# Patient Record
Sex: Female | Born: 1940
Health system: Southern US, Community
[De-identification: ages and names within clinical notes are randomized; demographics above are authoritative.]

## PROBLEM LIST (undated history)

## (undated) DIAGNOSIS — I509 Heart failure, unspecified: Secondary | ICD-10-CM

## (undated) DIAGNOSIS — E119 Type 2 diabetes mellitus without complications: Secondary | ICD-10-CM

## (undated) DIAGNOSIS — I10 Essential (primary) hypertension: Secondary | ICD-10-CM

## (undated) HISTORY — PX: EYE SURGERY: SHX253

---

## 2013-08-14 ENCOUNTER — Emergency Department (INDEPENDENT_AMBULATORY_CARE_PROVIDER_SITE_OTHER)
Admission: EM | Admit: 2013-08-14 | Discharge: 2013-08-14 | Disposition: A | Discharge status: Home / Self Care | Payer: Self-pay | Source: Home / Self Care | Attending: Family Medicine | Admitting: Family Medicine

## 2013-08-14 ENCOUNTER — Encounter (HOSPITAL_COMMUNITY): Payer: Self-pay | Admitting: Emergency Medicine

## 2013-08-14 ENCOUNTER — Emergency Department (INDEPENDENT_AMBULATORY_CARE_PROVIDER_SITE_OTHER): Payer: Self-pay

## 2013-08-14 DIAGNOSIS — M19019 Primary osteoarthritis, unspecified shoulder: Secondary | ICD-10-CM

## 2013-08-14 DIAGNOSIS — M12811 Other specific arthropathies, not elsewhere classified, right shoulder: Secondary | ICD-10-CM

## 2013-08-14 MED ORDER — TRAMADOL HCL 50 MG PO TABS: 50.0000 mg | ORAL_TABLET | Freq: Four times a day (QID) | ORAL | Status: DC | PRN

## 2013-08-14 NOTE — ED Notes (Signed)
Pain in shoulder; assess by MD only

## 2013-08-14 NOTE — ED Provider Notes (Signed)
Wanda Johnston is a 72 y.o. female who presents to Urgent Care today for right shoulder pain. Patient fell onto her outstretched right arm for 5 days ago. She had mild shoulder pain initially over her pain is worsening over the last several days. She has pain with attempted abduction and inability to abduct her arm more than about 90 actively. She denies any neck pain radiating pain weakness or numbness. The pain is moderate and worse with activity.    History reviewed. No pertinent past medical history. History of migraine headaches History  Substance Use Topics  . Smoking status: Never Smoker   . Smokeless tobacco: Not on file  . Alcohol Use: Not on file   ROS as above Medications reviewed. No current facility-administered medications for this encounter.   Current Outpatient Prescriptions  Medication Sig Dispense Refill  . traMADol (ULTRAM) 50 MG tablet Take 1 tablet (50 mg total) by mouth every 6 (six) hours as needed for pain.  30 tablet  0    Exam:  BP 159/67  Pulse 73  Temp(Src) 98.2 F (36.8 C) (Oral)  Resp 18  SpO2 95% Gen: Well NAD NECK: Nontender to spinal midline normal neck range of motion. Tender palpation right trapezius.  Right shoulder: Normal-appearing. Mildly tender overlying the a.c. Joint Normal external and internal range of motion Active abduction limited to about 90 passive full Week with resisted abduction. Week with resisted internal rotation. Normal external rotation strength.  Mildly positive impingement testing.  Left shoulder: Nontender normal range of motion normal strength.  Grip strength capillary refill sensation is intact distally  Limited musculoskeletal ultrasound of the right shoulder: Biceps tendon is intact and in the groove Subscapularis is degenerative appearing however appears to be grossly intact Supraspinatus has a linear hypoechoic streak characteristic of a full thickness tear without retraction Infraspinatus is  normal-appearing A.c. joint is degenerative appearing with effusion  No results found for this or any previous visit (from the past 24 hour(s)). Dg Shoulder Right  08/14/2013   CLINICAL DATA:  Right shoulder pain after fall.  EXAM: RIGHT SHOULDER - 2+ VIEW  COMPARISON:  None.  FINDINGS: There is no evidence of fracture or dislocation. The right acromioclavicular and glenohumeral joints appear normal. Focal calcification is seen lateral to the greater tuberosity which may represent calcific tendinitis. Soft tissues are unremarkable.  IMPRESSION: Focal calcification seen near greater tuberosity which may represent calcific tendinitis. No other abnormality seen in the right shoulder.   Electronically Signed   By: Roque Lias M.D.   On: 08/14/2013 16:08    Assessment and Plan: 72 y.o. female with full thickness right shoulder rotator cuff tear. Appears to be not retracted.  Plan: Discussed options. Patient does not have health insurance and is likely not a candidate additionally for rotator cuff repair. Plan for range of motion exercises. Will return in 2 weeks at that time will start strengthening program. Additionally I refer patient to the orange card and hopefully we'll be able to get her seen at the [facility].  She is not a candidate for nitroglycerin patch protocol due to history of headaches. Tramadol for pain Discussed warning signs or symptoms. Please see discharge instructions. Patient expresses understanding.     Rodolph Bong, MD 08/14/13 [phone]

## 2014-08-05 DIAGNOSIS — L2084 Intrinsic (allergic) eczema: Secondary | ICD-10-CM | POA: Diagnosis not present

## 2014-08-05 DIAGNOSIS — I10 Essential (primary) hypertension: Secondary | ICD-10-CM | POA: Diagnosis not present

## 2014-08-05 DIAGNOSIS — Z0000 Encounter for general adult medical examination without abnormal findings: Secondary | ICD-10-CM | POA: Diagnosis not present

## 2014-08-05 DIAGNOSIS — F0390 Unspecified dementia without behavioral disturbance: Secondary | ICD-10-CM | POA: Diagnosis not present

## 2014-08-05 DIAGNOSIS — Z800 Family history of malignant neoplasm of digestive organs: Secondary | ICD-10-CM | POA: Diagnosis not present

## 2014-08-05 DIAGNOSIS — Z79899 Other long term (current) drug therapy: Secondary | ICD-10-CM | POA: Diagnosis not present

## 2014-08-05 DIAGNOSIS — H7290 Unspecified perforation of tympanic membrane, unspecified ear: Secondary | ICD-10-CM | POA: Diagnosis not present

## 2014-08-05 DIAGNOSIS — H9190 Unspecified hearing loss, unspecified ear: Secondary | ICD-10-CM | POA: Diagnosis not present

## 2014-08-05 DIAGNOSIS — R740 Nonspecific elevation of levels of transaminase and lactic acid dehydrogenase [LDH]: Secondary | ICD-10-CM | POA: Diagnosis not present

## 2014-08-19 ENCOUNTER — Other Ambulatory Visit: Payer: Self-pay | Admitting: Family Medicine

## 2014-08-19 DIAGNOSIS — R748 Abnormal levels of other serum enzymes: Secondary | ICD-10-CM

## 2015-01-17 DIAGNOSIS — G5600 Carpal tunnel syndrome, unspecified upper limb: Secondary | ICD-10-CM | POA: Diagnosis not present

## 2015-01-17 DIAGNOSIS — I10 Essential (primary) hypertension: Secondary | ICD-10-CM | POA: Diagnosis not present

## 2017-03-14 DIAGNOSIS — Z01812 Encounter for preprocedural laboratory examination: Secondary | ICD-10-CM | POA: Diagnosis not present

## 2017-03-14 DIAGNOSIS — Z23 Encounter for immunization: Secondary | ICD-10-CM | POA: Diagnosis not present

## 2017-03-14 DIAGNOSIS — E782 Mixed hyperlipidemia: Secondary | ICD-10-CM | POA: Diagnosis not present

## 2017-03-14 DIAGNOSIS — Z01818 Encounter for other preprocedural examination: Secondary | ICD-10-CM | POA: Diagnosis not present

## 2017-03-14 DIAGNOSIS — E038 Other specified hypothyroidism: Secondary | ICD-10-CM | POA: Diagnosis not present

## 2017-03-14 DIAGNOSIS — H547 Unspecified visual loss: Secondary | ICD-10-CM | POA: Diagnosis not present

## 2017-03-14 DIAGNOSIS — I10 Essential (primary) hypertension: Secondary | ICD-10-CM | POA: Diagnosis not present

## 2017-03-14 DIAGNOSIS — Z79899 Other long term (current) drug therapy: Secondary | ICD-10-CM | POA: Diagnosis not present

## 2017-04-04 DIAGNOSIS — I10 Essential (primary) hypertension: Secondary | ICD-10-CM | POA: Diagnosis not present

## 2017-04-04 DIAGNOSIS — R55 Syncope and collapse: Secondary | ICD-10-CM | POA: Diagnosis not present

## 2017-04-04 DIAGNOSIS — E039 Hypothyroidism, unspecified: Secondary | ICD-10-CM | POA: Diagnosis not present

## 2017-04-04 DIAGNOSIS — H547 Unspecified visual loss: Secondary | ICD-10-CM | POA: Diagnosis not present

## 2017-06-05 DIAGNOSIS — H2512 Age-related nuclear cataract, left eye: Secondary | ICD-10-CM | POA: Diagnosis not present

## 2017-06-05 DIAGNOSIS — H40013 Open angle with borderline findings, low risk, bilateral: Secondary | ICD-10-CM | POA: Diagnosis not present

## 2017-06-05 DIAGNOSIS — H269 Unspecified cataract: Secondary | ICD-10-CM | POA: Diagnosis not present

## 2017-06-05 DIAGNOSIS — H25013 Cortical age-related cataract, bilateral: Secondary | ICD-10-CM | POA: Diagnosis not present

## 2017-06-05 DIAGNOSIS — H2513 Age-related nuclear cataract, bilateral: Secondary | ICD-10-CM | POA: Diagnosis not present

## 2017-06-05 DIAGNOSIS — H25042 Posterior subcapsular polar age-related cataract, left eye: Secondary | ICD-10-CM | POA: Diagnosis not present

## 2017-07-03 DIAGNOSIS — H2522 Age-related cataract, morgagnian type, left eye: Secondary | ICD-10-CM | POA: Diagnosis not present

## 2017-07-03 DIAGNOSIS — H25812 Combined forms of age-related cataract, left eye: Secondary | ICD-10-CM | POA: Diagnosis not present

## 2017-07-15 DIAGNOSIS — H2511 Age-related nuclear cataract, right eye: Secondary | ICD-10-CM | POA: Diagnosis not present

## 2017-07-15 DIAGNOSIS — H25011 Cortical age-related cataract, right eye: Secondary | ICD-10-CM | POA: Diagnosis not present

## 2017-07-17 DIAGNOSIS — H25811 Combined forms of age-related cataract, right eye: Secondary | ICD-10-CM | POA: Diagnosis not present

## 2017-07-17 DIAGNOSIS — H2511 Age-related nuclear cataract, right eye: Secondary | ICD-10-CM | POA: Diagnosis not present

## 2017-11-14 DIAGNOSIS — H04123 Dry eye syndrome of bilateral lacrimal glands: Secondary | ICD-10-CM | POA: Diagnosis not present

## 2017-11-14 DIAGNOSIS — H40013 Open angle with borderline findings, low risk, bilateral: Secondary | ICD-10-CM | POA: Diagnosis not present

## 2017-11-14 DIAGNOSIS — H04223 Epiphora due to insufficient drainage, bilateral lacrimal glands: Secondary | ICD-10-CM | POA: Diagnosis not present

## 2018-03-26 DIAGNOSIS — H04122 Dry eye syndrome of left lacrimal gland: Secondary | ICD-10-CM | POA: Diagnosis not present

## 2018-03-26 DIAGNOSIS — H04221 Epiphora due to insufficient drainage, right lacrimal gland: Secondary | ICD-10-CM | POA: Diagnosis not present

## 2018-03-26 DIAGNOSIS — H04123 Dry eye syndrome of bilateral lacrimal glands: Secondary | ICD-10-CM | POA: Diagnosis not present

## 2018-03-26 DIAGNOSIS — Z961 Presence of intraocular lens: Secondary | ICD-10-CM | POA: Diagnosis not present

## 2018-03-26 DIAGNOSIS — H04223 Epiphora due to insufficient drainage, bilateral lacrimal glands: Secondary | ICD-10-CM | POA: Diagnosis not present

## 2018-03-26 DIAGNOSIS — H04121 Dry eye syndrome of right lacrimal gland: Secondary | ICD-10-CM | POA: Diagnosis not present

## 2018-03-26 DIAGNOSIS — H40013 Open angle with borderline findings, low risk, bilateral: Secondary | ICD-10-CM | POA: Diagnosis not present

## 2018-03-26 DIAGNOSIS — H04222 Epiphora due to insufficient drainage, left lacrimal gland: Secondary | ICD-10-CM | POA: Diagnosis not present

## 2018-05-12 DIAGNOSIS — E782 Mixed hyperlipidemia: Secondary | ICD-10-CM | POA: Diagnosis not present

## 2018-05-12 DIAGNOSIS — Z1211 Encounter for screening for malignant neoplasm of colon: Secondary | ICD-10-CM | POA: Diagnosis not present

## 2018-05-12 DIAGNOSIS — Z79899 Other long term (current) drug therapy: Secondary | ICD-10-CM | POA: Diagnosis not present

## 2018-05-12 DIAGNOSIS — R7301 Impaired fasting glucose: Secondary | ICD-10-CM | POA: Diagnosis not present

## 2018-05-12 DIAGNOSIS — E0590 Thyrotoxicosis, unspecified without thyrotoxic crisis or storm: Secondary | ICD-10-CM | POA: Diagnosis not present

## 2018-05-12 DIAGNOSIS — E039 Hypothyroidism, unspecified: Secondary | ICD-10-CM | POA: Diagnosis not present

## 2018-05-12 DIAGNOSIS — Z23 Encounter for immunization: Secondary | ICD-10-CM | POA: Diagnosis not present

## 2018-05-12 DIAGNOSIS — F0390 Unspecified dementia without behavioral disturbance: Secondary | ICD-10-CM | POA: Diagnosis not present

## 2018-05-12 DIAGNOSIS — Z0000 Encounter for general adult medical examination without abnormal findings: Secondary | ICD-10-CM | POA: Diagnosis not present

## 2018-05-12 DIAGNOSIS — I10 Essential (primary) hypertension: Secondary | ICD-10-CM | POA: Diagnosis not present

## 2018-05-12 DIAGNOSIS — E559 Vitamin D deficiency, unspecified: Secondary | ICD-10-CM | POA: Diagnosis not present

## 2018-05-12 DIAGNOSIS — G301 Alzheimer's disease with late onset: Secondary | ICD-10-CM | POA: Diagnosis not present

## 2018-05-12 DIAGNOSIS — R7303 Prediabetes: Secondary | ICD-10-CM | POA: Diagnosis not present

## 2018-05-19 DIAGNOSIS — E559 Vitamin D deficiency, unspecified: Secondary | ICD-10-CM | POA: Diagnosis not present

## 2018-05-19 DIAGNOSIS — E1169 Type 2 diabetes mellitus with other specified complication: Secondary | ICD-10-CM | POA: Diagnosis not present

## 2018-05-19 DIAGNOSIS — E782 Mixed hyperlipidemia: Secondary | ICD-10-CM | POA: Diagnosis not present

## 2019-04-06 DIAGNOSIS — Z961 Presence of intraocular lens: Secondary | ICD-10-CM | POA: Diagnosis not present

## 2019-04-06 DIAGNOSIS — H26493 Other secondary cataract, bilateral: Secondary | ICD-10-CM | POA: Diagnosis not present

## 2019-04-06 DIAGNOSIS — H40013 Open angle with borderline findings, low risk, bilateral: Secondary | ICD-10-CM | POA: Diagnosis not present

## 2019-04-06 DIAGNOSIS — H04123 Dry eye syndrome of bilateral lacrimal glands: Secondary | ICD-10-CM | POA: Diagnosis not present

## 2019-06-15 DIAGNOSIS — H04123 Dry eye syndrome of bilateral lacrimal glands: Secondary | ICD-10-CM | POA: Diagnosis not present

## 2019-06-15 DIAGNOSIS — H16223 Keratoconjunctivitis sicca, not specified as Sjogren's, bilateral: Secondary | ICD-10-CM | POA: Diagnosis not present

## 2019-07-13 DIAGNOSIS — E0590 Thyrotoxicosis, unspecified without thyrotoxic crisis or storm: Secondary | ICD-10-CM | POA: Diagnosis not present

## 2019-07-13 DIAGNOSIS — Z1211 Encounter for screening for malignant neoplasm of colon: Secondary | ICD-10-CM | POA: Diagnosis not present

## 2019-07-13 DIAGNOSIS — E559 Vitamin D deficiency, unspecified: Secondary | ICD-10-CM | POA: Diagnosis not present

## 2019-07-13 DIAGNOSIS — I10 Essential (primary) hypertension: Secondary | ICD-10-CM | POA: Diagnosis not present

## 2019-07-13 DIAGNOSIS — Z0000 Encounter for general adult medical examination without abnormal findings: Secondary | ICD-10-CM | POA: Diagnosis not present

## 2019-07-13 DIAGNOSIS — E782 Mixed hyperlipidemia: Secondary | ICD-10-CM | POA: Diagnosis not present

## 2019-07-13 DIAGNOSIS — Z79899 Other long term (current) drug therapy: Secondary | ICD-10-CM | POA: Diagnosis not present

## 2019-07-13 DIAGNOSIS — E1169 Type 2 diabetes mellitus with other specified complication: Secondary | ICD-10-CM | POA: Diagnosis not present

## 2019-07-13 DIAGNOSIS — Z7984 Long term (current) use of oral hypoglycemic drugs: Secondary | ICD-10-CM | POA: Diagnosis not present

## 2019-07-13 DIAGNOSIS — H7290 Unspecified perforation of tympanic membrane, unspecified ear: Secondary | ICD-10-CM | POA: Diagnosis not present

## 2019-07-13 DIAGNOSIS — L309 Dermatitis, unspecified: Secondary | ICD-10-CM | POA: Diagnosis not present

## 2019-07-13 DIAGNOSIS — G301 Alzheimer's disease with late onset: Secondary | ICD-10-CM | POA: Diagnosis not present

## 2019-07-31 DIAGNOSIS — G720 Drug-induced myopathy: Secondary | ICD-10-CM | POA: Diagnosis not present

## 2019-07-31 DIAGNOSIS — E1169 Type 2 diabetes mellitus with other specified complication: Secondary | ICD-10-CM | POA: Diagnosis not present

## 2019-09-21 DIAGNOSIS — E7800 Pure hypercholesterolemia, unspecified: Secondary | ICD-10-CM | POA: Diagnosis not present

## 2019-09-21 DIAGNOSIS — Z79899 Other long term (current) drug therapy: Secondary | ICD-10-CM | POA: Diagnosis not present

## 2019-09-21 DIAGNOSIS — E0590 Thyrotoxicosis, unspecified without thyrotoxic crisis or storm: Secondary | ICD-10-CM | POA: Diagnosis not present

## 2019-09-21 DIAGNOSIS — Z7984 Long term (current) use of oral hypoglycemic drugs: Secondary | ICD-10-CM | POA: Diagnosis not present

## 2019-09-21 DIAGNOSIS — E1165 Type 2 diabetes mellitus with hyperglycemia: Secondary | ICD-10-CM | POA: Diagnosis not present

## 2020-01-05 DIAGNOSIS — Z20828 Contact with and (suspected) exposure to other viral communicable diseases: Secondary | ICD-10-CM | POA: Diagnosis not present

## 2020-01-27 DIAGNOSIS — Z20822 Contact with and (suspected) exposure to covid-19: Secondary | ICD-10-CM | POA: Diagnosis not present

## 2020-05-26 DIAGNOSIS — E1165 Type 2 diabetes mellitus with hyperglycemia: Secondary | ICD-10-CM | POA: Diagnosis not present

## 2020-05-26 DIAGNOSIS — Z79899 Other long term (current) drug therapy: Secondary | ICD-10-CM | POA: Diagnosis not present

## 2020-05-26 DIAGNOSIS — E7800 Pure hypercholesterolemia, unspecified: Secondary | ICD-10-CM | POA: Diagnosis not present

## 2020-05-26 DIAGNOSIS — E0590 Thyrotoxicosis, unspecified without thyrotoxic crisis or storm: Secondary | ICD-10-CM | POA: Diagnosis not present

## 2020-08-01 DIAGNOSIS — Z800 Family history of malignant neoplasm of digestive organs: Secondary | ICD-10-CM | POA: Diagnosis not present

## 2020-08-01 DIAGNOSIS — Z79899 Other long term (current) drug therapy: Secondary | ICD-10-CM | POA: Diagnosis not present

## 2020-08-01 DIAGNOSIS — H9190 Unspecified hearing loss, unspecified ear: Secondary | ICD-10-CM | POA: Diagnosis not present

## 2020-08-01 DIAGNOSIS — E7800 Pure hypercholesterolemia, unspecified: Secondary | ICD-10-CM | POA: Diagnosis not present

## 2020-08-01 DIAGNOSIS — Z0000 Encounter for general adult medical examination without abnormal findings: Secondary | ICD-10-CM | POA: Diagnosis not present

## 2020-08-01 DIAGNOSIS — L2084 Intrinsic (allergic) eczema: Secondary | ICD-10-CM | POA: Diagnosis not present

## 2020-08-01 DIAGNOSIS — I10 Essential (primary) hypertension: Secondary | ICD-10-CM | POA: Diagnosis not present

## 2020-08-01 DIAGNOSIS — G720 Drug-induced myopathy: Secondary | ICD-10-CM | POA: Diagnosis not present

## 2020-08-01 DIAGNOSIS — E0590 Thyrotoxicosis, unspecified without thyrotoxic crisis or storm: Secondary | ICD-10-CM | POA: Diagnosis not present

## 2020-08-01 DIAGNOSIS — E1165 Type 2 diabetes mellitus with hyperglycemia: Secondary | ICD-10-CM | POA: Diagnosis not present

## 2020-08-01 DIAGNOSIS — G301 Alzheimer's disease with late onset: Secondary | ICD-10-CM | POA: Diagnosis not present

## 2020-08-01 DIAGNOSIS — E11620 Type 2 diabetes mellitus with diabetic dermatitis: Secondary | ICD-10-CM | POA: Diagnosis not present

## 2020-10-31 DIAGNOSIS — U071 COVID-19: Secondary | ICD-10-CM | POA: Diagnosis not present

## 2020-10-31 DIAGNOSIS — R059 Cough, unspecified: Secondary | ICD-10-CM | POA: Diagnosis not present

## 2020-10-31 DIAGNOSIS — B349 Viral infection, unspecified: Secondary | ICD-10-CM | POA: Diagnosis not present

## 2020-11-02 ENCOUNTER — Encounter (HOSPITAL_BASED_OUTPATIENT_CLINIC_OR_DEPARTMENT_OTHER): Payer: Self-pay

## 2020-11-02 ENCOUNTER — Emergency Department (HOSPITAL_BASED_OUTPATIENT_CLINIC_OR_DEPARTMENT_OTHER): Payer: Medicare Other

## 2020-11-02 ENCOUNTER — Other Ambulatory Visit: Payer: Self-pay

## 2020-11-02 ENCOUNTER — Inpatient Hospital Stay (HOSPITAL_BASED_OUTPATIENT_CLINIC_OR_DEPARTMENT_OTHER)
Admission: EM | Admit: 2020-11-02 | Discharge: 2020-11-07 | DRG: 177 | Disposition: A | Discharge status: Home / Self Care | Payer: Medicare Other | Attending: Internal Medicine | Admitting: Internal Medicine

## 2020-11-02 DIAGNOSIS — Z79899 Other long term (current) drug therapy: Secondary | ICD-10-CM

## 2020-11-02 DIAGNOSIS — E1165 Type 2 diabetes mellitus with hyperglycemia: Secondary | ICD-10-CM | POA: Diagnosis present

## 2020-11-02 DIAGNOSIS — R739 Hyperglycemia, unspecified: Secondary | ICD-10-CM | POA: Diagnosis present

## 2020-11-02 DIAGNOSIS — J1282 Pneumonia due to coronavirus disease 2019: Secondary | ICD-10-CM | POA: Diagnosis present

## 2020-11-02 DIAGNOSIS — E869 Volume depletion, unspecified: Secondary | ICD-10-CM | POA: Diagnosis not present

## 2020-11-02 DIAGNOSIS — E871 Hypo-osmolality and hyponatremia: Secondary | ICD-10-CM | POA: Diagnosis not present

## 2020-11-02 DIAGNOSIS — U071 COVID-19: Principal | ICD-10-CM | POA: Diagnosis present

## 2020-11-02 DIAGNOSIS — J9601 Acute respiratory failure with hypoxia: Secondary | ICD-10-CM | POA: Diagnosis not present

## 2020-11-02 DIAGNOSIS — K824 Cholesterolosis of gallbladder: Secondary | ICD-10-CM | POA: Diagnosis present

## 2020-11-02 DIAGNOSIS — R0602 Shortness of breath: Secondary | ICD-10-CM | POA: Diagnosis not present

## 2020-11-02 DIAGNOSIS — E876 Hypokalemia: Secondary | ICD-10-CM | POA: Diagnosis present

## 2020-11-02 DIAGNOSIS — I110 Hypertensive heart disease with heart failure: Secondary | ICD-10-CM | POA: Diagnosis present

## 2020-11-02 DIAGNOSIS — T380X5A Adverse effect of glucocorticoids and synthetic analogues, initial encounter: Secondary | ICD-10-CM | POA: Diagnosis not present

## 2020-11-02 DIAGNOSIS — Z794 Long term (current) use of insulin: Secondary | ICD-10-CM

## 2020-11-02 DIAGNOSIS — K219 Gastro-esophageal reflux disease without esophagitis: Secondary | ICD-10-CM | POA: Diagnosis present

## 2020-11-02 DIAGNOSIS — E785 Hyperlipidemia, unspecified: Secondary | ICD-10-CM | POA: Diagnosis present

## 2020-11-02 DIAGNOSIS — R7989 Other specified abnormal findings of blood chemistry: Secondary | ICD-10-CM | POA: Diagnosis present

## 2020-11-02 DIAGNOSIS — I509 Heart failure, unspecified: Secondary | ICD-10-CM | POA: Diagnosis present

## 2020-11-02 HISTORY — DX: Type 2 diabetes mellitus without complications: E11.9

## 2020-11-02 HISTORY — DX: Heart failure, unspecified: I50.9

## 2020-11-02 HISTORY — DX: Essential (primary) hypertension: I10

## 2020-11-02 LAB — CBC WITH DIFFERENTIAL/PLATELET
Abs Immature Granulocytes: 0.02 10*3/uL (ref 0.00–0.07)
Basophils Absolute: 0 10*3/uL (ref 0.0–0.1)
Basophils Relative: 0 %
Eosinophils Absolute: 0.1 10*3/uL (ref 0.0–0.5)
Eosinophils Relative: 1 %
HCT: 42.6 % (ref 36.0–46.0)
Hemoglobin: 14.5 g/dL (ref 12.0–15.0)
Immature Granulocytes: 0 %
Lymphocytes Relative: 55 %
Lymphs Abs: 3.3 10*3/uL (ref 0.7–4.0)
MCH: 31.3 pg (ref 26.0–34.0)
MCHC: 34 g/dL (ref 30.0–36.0)
MCV: 92 fL (ref 80.0–100.0)
Monocytes Absolute: 0.6 10*3/uL (ref 0.1–1.0)
Monocytes Relative: 10 %
Neutro Abs: 2 10*3/uL (ref 1.7–7.7)
Neutrophils Relative %: 34 %
Platelets: 154 10*3/uL (ref 150–400)
RBC: 4.63 MIL/uL (ref 3.87–5.11)
RDW: 12.6 % (ref 11.5–15.5)
WBC: 6.1 10*3/uL (ref 4.0–10.5)
nRBC: 0 % (ref 0.0–0.2)

## 2020-11-02 LAB — RESP PANEL BY RT-PCR (FLU A&B, COVID) ARPGX2
Influenza A by PCR: NEGATIVE
Influenza B by PCR: NEGATIVE
SARS Coronavirus 2 by RT PCR: POSITIVE — AB

## 2020-11-02 LAB — COMPREHENSIVE METABOLIC PANEL
ALT: 61 U/L — ABNORMAL HIGH (ref 0–44)
AST: 85 U/L — ABNORMAL HIGH (ref 15–41)
Albumin: 3.5 g/dL (ref 3.5–5.0)
Alkaline Phosphatase: 56 U/L (ref 38–126)
Anion gap: 12 (ref 5–15)
BUN: 13 mg/dL (ref 8–23)
CO2: 26 mmol/L (ref 22–32)
Calcium: 8.1 mg/dL — ABNORMAL LOW (ref 8.9–10.3)
Chloride: 92 mmol/L — ABNORMAL LOW (ref 98–111)
Creatinine, Ser: 0.86 mg/dL (ref 0.44–1.00)
GFR, Estimated: >60 mL/min (ref >60)
Glucose, Bld: 175 mg/dL — ABNORMAL HIGH (ref 70–99)
Potassium: 2.8 mmol/L — ABNORMAL LOW (ref 3.5–5.1)
Sodium: 130 mmol/L — ABNORMAL LOW (ref 135–145)
Total Bilirubin: 0.6 mg/dL (ref 0.3–1.2)
Total Protein: 7.5 g/dL (ref 6.5–8.1)

## 2020-11-02 LAB — FIBRINOGEN: Fibrinogen: 492 mg/dL — ABNORMAL HIGH (ref 210–475)

## 2020-11-02 LAB — LACTATE DEHYDROGENASE: LDH: 329 U/L — ABNORMAL HIGH (ref 98–192)

## 2020-11-02 LAB — CBG MONITORING, ED
Glucose-Capillary: 175 mg/dL — ABNORMAL HIGH (ref 70–99)
Glucose-Capillary: 283 mg/dL — ABNORMAL HIGH (ref 70–99)
Glucose-Capillary: 313 mg/dL — ABNORMAL HIGH (ref 70–99)

## 2020-11-02 LAB — LACTIC ACID, PLASMA: Lactic Acid, Venous: 1.4 mmol/L (ref 0.5–1.9)

## 2020-11-02 LAB — PROCALCITONIN: Procalcitonin: <0.1 ng/mL

## 2020-11-02 LAB — MAGNESIUM: Magnesium: 1.6 mg/dL — ABNORMAL LOW (ref 1.7–2.4)

## 2020-11-02 LAB — D-DIMER, QUANTITATIVE: D-Dimer, Quant: 0.89 ug/mL-FEU — ABNORMAL HIGH (ref 0.00–0.50)

## 2020-11-02 LAB — C-REACTIVE PROTEIN: CRP: 3.7 mg/dL — ABNORMAL HIGH (ref <1.0)

## 2020-11-02 LAB — TRIGLYCERIDES: Triglycerides: 208 mg/dL — ABNORMAL HIGH (ref <150)

## 2020-11-02 LAB — FERRITIN: Ferritin: 1467 ng/mL — ABNORMAL HIGH (ref 11–307)

## 2020-11-02 MED ORDER — DEXAMETHASONE SODIUM PHOSPHATE 10 MG/ML IJ SOLN
6.0000 mg | Freq: Once | INTRAMUSCULAR | Status: AC
  Administered 2020-11-02: 6 mg via INTRAVENOUS
  Filled 2020-11-02: qty 1

## 2020-11-02 MED ORDER — INSULIN ASPART 100 UNIT/ML ~~LOC~~ SOLN
0.0000 [IU] | Freq: Every day | SUBCUTANEOUS | Status: DC
  Administered 2020-11-02 – 2020-11-03 (×2): 4 [IU] via SUBCUTANEOUS
  Filled 2020-11-02: qty 4

## 2020-11-02 MED ORDER — METHYLPREDNISOLONE SODIUM SUCC 125 MG IJ SOLR
80.0000 mg | INTRAMUSCULAR | Status: DC
  Administered 2020-11-02 – 2020-11-06 (×5): 80 mg via INTRAVENOUS
  Filled 2020-11-02 (×6): qty 2

## 2020-11-02 MED ORDER — SODIUM CHLORIDE 0.9 % IV SOLN
100.0000 mg | Freq: Once | INTRAVENOUS | Status: AC
  Administered 2020-11-02: 100 mg via INTRAVENOUS

## 2020-11-02 MED ORDER — SODIUM CHLORIDE 0.9 % IV SOLN
100.0000 mg | Freq: Every day | INTRAVENOUS | Status: AC
  Administered 2020-11-02 – 2020-11-06 (×5): 100 mg via INTRAVENOUS
  Filled 2020-11-02 (×3): qty 20

## 2020-11-02 MED ORDER — INSULIN ASPART 100 UNIT/ML ~~LOC~~ SOLN
0.0000 [IU] | Freq: Three times a day (TID) | SUBCUTANEOUS | Status: DC
  Administered 2020-11-03: 8 [IU] via SUBCUTANEOUS
  Administered 2020-11-03: 11 [IU] via SUBCUTANEOUS
  Administered 2020-11-03: 8 [IU] via SUBCUTANEOUS
  Administered 2020-11-04 (×2): 5 [IU] via SUBCUTANEOUS
  Administered 2020-11-04: 3 [IU] via SUBCUTANEOUS
  Administered 2020-11-05: 5 [IU] via SUBCUTANEOUS
  Administered 2020-11-05 (×2): 3 [IU] via SUBCUTANEOUS
  Administered 2020-11-06 (×2): 5 [IU] via SUBCUTANEOUS
  Administered 2020-11-06: 3 [IU] via SUBCUTANEOUS
  Administered 2020-11-07 (×2): 11 [IU] via SUBCUTANEOUS

## 2020-11-02 MED ORDER — ACETAMINOPHEN 325 MG PO TABS
650.0000 mg | ORAL_TABLET | Freq: Once | ORAL | Status: AC
  Administered 2020-11-02: 650 mg via ORAL
  Filled 2020-11-02: qty 2

## 2020-11-02 MED ORDER — MAGNESIUM SULFATE 2 GM/50ML IV SOLN
2.0000 g | Freq: Once | INTRAVENOUS | Status: AC
  Administered 2020-11-02: 2 g via INTRAVENOUS
  Filled 2020-11-02: qty 50

## 2020-11-02 MED ORDER — SODIUM CHLORIDE 0.9 % IV SOLN
100.0000 mg | Freq: Once | INTRAVENOUS | Status: AC
  Administered 2020-11-06: 100 mg via INTRAVENOUS
  Filled 2020-11-02: qty 20

## 2020-11-02 MED ORDER — POTASSIUM CHLORIDE CRYS ER 20 MEQ PO TBCR
40.0000 meq | EXTENDED_RELEASE_TABLET | Freq: Once | ORAL | Status: AC
  Administered 2020-11-02: 40 meq via ORAL
  Filled 2020-11-02: qty 2

## 2020-11-02 MED ORDER — ALBUTEROL SULFATE HFA 108 (90 BASE) MCG/ACT IN AERS: 2.0000 | INHALATION_SPRAY | RESPIRATORY_TRACT | Status: DC | PRN

## 2020-11-02 NOTE — ED Provider Notes (Signed)
[facility] EMERGENCY DEPARTMENT Provider Note   CSN: [id number] Arrival date & time: 11/02/20  1656     History Chief Complaint  Patient presents with  . Covid Positive    Wanda Johnston is a 80 y.o. female.  The history is provided by the patient, medical records and a relative.   Wanda Johnston is a 80 y.o. female who presents to the Emergency Department complaining of difficulty breathing. She presents the emergency department accompanied by her daughter for evaluation of difficulty breathing. She began feeling ill on December 30 with nausea, vomiting and malaise. Her symptoms progressed have sore throat and body aches. Fever started on Monday and she began having difficulty breathing today. Her daughter checked her pulse ox because she was having dizziness when trying to ambulate. Daughter reports pulse ox of 88% at rest and drops to the 70s if she attempts to ambulate. She has a history of CHF, diabetes, hypertension. She had an outpatient COVID test performed on Monday and was resulted back today. It was positive. She has not been vaccinated for COVID-19. She has been exposed to individuals with COVID-19 a week ago.    Past Medical History:  Diagnosis Date  . CHF (congestive heart failure) (HCC)   . Diabetes mellitus without complication (HCC)   . Hypertension     Patient Active Problem List   Diagnosis Date Noted  . Acute respiratory failure with hypoxia (HCC) 11/02/2020    Past Surgical History:  Procedure Laterality Date  . EYE SURGERY       OB History   No obstetric history on file.     History reviewed. No pertinent family history.  Social History   Tobacco Use  . Smoking status: Never Smoker  Substance Use Topics  . Alcohol use: Never  . Drug use: Never    Home Medications Prior to Admission medications   Medication Sig Start Date End Date Taking? Authorizing Provider  traMADol (ULTRAM) 50 MG tablet Take 1 tablet (50 mg total) by mouth  every 6 (six) hours as needed for pain. 08/14/13   Rodolph Bong, MD    Allergies    Patient has no known allergies.  Review of Systems   Review of Systems  All other systems reviewed and are negative.   Physical Exam Updated Vital Signs BP 134/74   Pulse 73   Temp (!) 100.7 F (38.2 C) (Oral) Comment: RN Jamie informed  Resp (!) 25   Ht 5' (1.524 m)   Wt 63.5 kg   SpO2 94%   BMI 27.34 kg/m   Physical Exam Vitals and nursing note reviewed.  Constitutional:      General: She is in acute distress.     Appearance: She is well-developed and well-nourished. She is ill-appearing.  HENT:     Head: Normocephalic and atraumatic.  Cardiovascular:     Rate and Rhythm: Normal rate and regular rhythm.     Heart sounds: No murmur heard.   Pulmonary:     Effort: Pulmonary effort is normal. No respiratory distress.     Breath sounds: Normal breath sounds.     Comments: Occasional crackles bilaterally Abdominal:     Palpations: Abdomen is soft.     Tenderness: There is no abdominal tenderness. There is no guarding or rebound.  Musculoskeletal:        General: No tenderness or edema.  Skin:    General: Skin is warm and dry.  Neurological:  Mental Status: She is alert and oriented to person, place, and time.  Psychiatric:        Mood and Affect: Mood and affect normal.        Behavior: Behavior normal.     ED Results / Procedures / Treatments   Labs (all labs ordered are listed, but only abnormal results are displayed) Labs Reviewed  RESP PANEL BY RT-PCR (FLU A&B, COVID) ARPGX2 - Abnormal; Notable for the following components:      Result Value   SARS Coronavirus 2 by RT PCR POSITIVE (*)    All other components within normal limits  COMPREHENSIVE METABOLIC PANEL - Abnormal; Notable for the following components:   Sodium 130 (*)    Potassium 2.8 (*)    Chloride 92 (*)    Glucose, Bld 175 (*)    Calcium 8.1 (*)    AST 85 (*)    ALT 61 (*)    All other components  within normal limits  D-DIMER, QUANTITATIVE (NOT AT [facility]) - Abnormal; Notable for the following components:   D-Dimer, Quant 0.89 (*)    All other components within normal limits  LACTATE DEHYDROGENASE - Abnormal; Notable for the following components:   LDH 329 (*)    All other components within normal limits  FERRITIN - Abnormal; Notable for the following components:   Ferritin 1,467 (*)    All other components within normal limits  TRIGLYCERIDES - Abnormal; Notable for the following components:   Triglycerides 208 (*)    All other components within normal limits  FIBRINOGEN - Abnormal; Notable for the following components:   Fibrinogen 492 (*)    All other components within normal limits  C-REACTIVE PROTEIN - Abnormal; Notable for the following components:   CRP 3.7 (*)    All other components within normal limits  MAGNESIUM - Abnormal; Notable for the following components:   Magnesium 1.6 (*)    All other components within normal limits  CBG MONITORING, ED - Abnormal; Notable for the following components:   Glucose-Capillary 175 (*)    All other components within normal limits  CBG MONITORING, ED - Abnormal; Notable for the following components:   Glucose-Capillary 313 (*)    All other components within normal limits  CULTURE, BLOOD (ROUTINE X 2)  CULTURE, BLOOD (ROUTINE X 2)  LACTIC ACID, PLASMA  CBC WITH DIFFERENTIAL/PLATELET  PROCALCITONIN  LACTIC ACID, PLASMA  HEMOGLOBIN A1C    EKG EKG Interpretation  Date/Time:  Wednesday November 02 2020 17:15:43 EST Ventricular Rate:  96 PR Interval:    QRS Duration: 85 QT Interval:  358 QTC Calculation: 453 R Axis:   -13 Text Interpretation: Sinus rhythm Probable anterior infarct, age indeterminate No previous tracing Confirmed by Tilden Fossa [phone] on 11/02/2020 5:37:14 PM   Radiology DG Chest Port 1 View  Result Date: 11/02/2020 CLINICAL DATA:  Shortness of breath, COVID positive. EXAM: PORTABLE CHEST 1 VIEW  COMPARISON:  None. FINDINGS: Mild, diffuse chronic appearing increased interstitial lung markings are seen. There is no evidence of a pleural effusion or pneumothorax. The cardiac silhouette is borderline in size. There is mild calcification of the aortic arch. The visualized skeletal structures are unremarkable. IMPRESSION: Chronic appearing increased interstitial lung markings without acute infiltrate. Electronically Signed   By: Aram Candela M.D.   On: 11/02/2020 17:44    Procedures Procedures (including critical care time) CRITICAL CARE Performed by: Tilden Fossa   Total critical care time: 35 minutes  Critical care time was exclusive  of separately billable procedures and treating other patients.  Critical care was necessary to treat or prevent imminent or life-threatening deterioration.  Critical care was time spent personally by me on the following activities: development of treatment plan with patient and/or surrogate as well as nursing, discussions with consultants, evaluation of patient's response to treatment, examination of patient, obtaining history from patient or surrogate, ordering and performing treatments and interventions, ordering and review of laboratory studies, ordering and review of radiographic studies, pulse oximetry and re-evaluation of patient's condition.  Medications Ordered in ED Medications  methylPREDNISolone sodium succinate (SOLU-MEDROL) 125 mg/2 mL injection 80 mg (80 mg Intravenous Given 11/02/20 2010)  albuterol (VENTOLIN HFA) 108 (90 Base) MCG/ACT inhaler 2 puff (has no administration in time range)  insulin aspart (novoLOG) injection 0-15 Units (has no administration in time range)  insulin aspart (novoLOG) injection 0-5 Units (4 Units Subcutaneous Given 11/02/20 2212)  remdesivir 100 mg in sodium chloride 0.9 % 100 mL IVPB (0 mg Intravenous Stopped 11/02/20 2102)    Followed by  remdesivir 100 mg in sodium chloride 0.9 % 100 mL IVPB (has no  administration in time range)  remdesivir 100 mg in sodium chloride 0.9 % 100 mL IVPB (0 mg Intravenous Stopped 11/02/20 2149)  acetaminophen (TYLENOL) tablet 650 mg (650 mg Oral Given 11/02/20 1755)  dexamethasone (DECADRON) injection 6 mg (6 mg Intravenous Given 11/02/20 1755)  potassium chloride SA (KLOR-CON) CR tablet 40 mEq (40 mEq Oral Given 11/02/20 1853)  magnesium sulfate IVPB 2 g 50 mL (0 g Intravenous Stopped 11/02/20 2012)    ED Course  I have reviewed the triage vital signs and the nursing notes.  Pertinent labs & imaging results that were available during my care of the patient were reviewed by me and considered in my medical decision making (see chart for details).    MDM Rules/Calculators/A&P                          Patient with history of CHF here for evaluation of shortness of breath, increased oxygen requirement starting today. She tested positive as an outpatient for COVID-19. She has a new oxygen requirement of 2 L nasal cannula to maintain oxygen sats in the mid-90s. BMP with mild hyponatremia, hypokalemia. Chest x-ray without evidence of volume overload. She was treated with Decadron for COVID pneumonia. Plan to admit for ongoing treatment. Hospitalist consulted for admission. Final Clinical Impression(s) / ED Diagnoses Final diagnoses:  Pneumonia due to COVID-19 virus    Rx / DC Orders ED Discharge Orders    None       Quintella Reichert, MD 11/02/20 2334

## 2020-11-02 NOTE — ED Notes (Signed)
Spoke with Dr Madilyn Hook, no need for 2nd lactate.

## 2020-11-02 NOTE — Progress Notes (Signed)
PT encouraged to self prone per order (utilized Daughter to translate). PT able to turn on to stomach with minimal effort. PT remains on stomach while utilizing 1.5 LPM 02 with current Sp02 of 98%.

## 2020-11-02 NOTE — ED Notes (Signed)
Ate her potatoes from froze dinner.

## 2020-11-02 NOTE — ED Triage Notes (Signed)
Pt dx with covid, symptoms started on 12/30. Pt has had n/v, sore throat, cough and dizziness. Pt reports shortness of breath when she walks. Per daughter pts oxygen is 88% while resting and drops to 79% with ambulation.

## 2020-11-02 NOTE — ED Notes (Signed)
Check CBG 283, RN Jamie informed

## 2020-11-02 NOTE — ED Notes (Signed)
Checked CBG 175, RN Jamie informed. Pt daughter requesting something to eat for pt due to pt not having anything to eat all day. Ok per [place]. [employer]  Chicken heated up.

## 2020-11-03 ENCOUNTER — Encounter (HOSPITAL_COMMUNITY): Payer: Self-pay | Admitting: Family Medicine

## 2020-11-03 ENCOUNTER — Inpatient Hospital Stay (HOSPITAL_COMMUNITY): Payer: Medicare Other

## 2020-11-03 DIAGNOSIS — J1282 Pneumonia due to coronavirus disease 2019: Secondary | ICD-10-CM | POA: Diagnosis present

## 2020-11-03 DIAGNOSIS — R7989 Other specified abnormal findings of blood chemistry: Secondary | ICD-10-CM | POA: Diagnosis present

## 2020-11-03 DIAGNOSIS — R739 Hyperglycemia, unspecified: Secondary | ICD-10-CM | POA: Diagnosis present

## 2020-11-03 DIAGNOSIS — E785 Hyperlipidemia, unspecified: Secondary | ICD-10-CM | POA: Diagnosis present

## 2020-11-03 DIAGNOSIS — E869 Volume depletion, unspecified: Secondary | ICD-10-CM | POA: Diagnosis present

## 2020-11-03 DIAGNOSIS — E1165 Type 2 diabetes mellitus with hyperglycemia: Secondary | ICD-10-CM | POA: Diagnosis present

## 2020-11-03 DIAGNOSIS — K824 Cholesterolosis of gallbladder: Secondary | ICD-10-CM | POA: Diagnosis present

## 2020-11-03 DIAGNOSIS — E876 Hypokalemia: Secondary | ICD-10-CM | POA: Diagnosis present

## 2020-11-03 DIAGNOSIS — E871 Hypo-osmolality and hyponatremia: Secondary | ICD-10-CM | POA: Diagnosis present

## 2020-11-03 DIAGNOSIS — Z79899 Other long term (current) drug therapy: Secondary | ICD-10-CM | POA: Diagnosis not present

## 2020-11-03 DIAGNOSIS — I509 Heart failure, unspecified: Secondary | ICD-10-CM | POA: Diagnosis present

## 2020-11-03 DIAGNOSIS — J9601 Acute respiratory failure with hypoxia: Secondary | ICD-10-CM | POA: Diagnosis present

## 2020-11-03 DIAGNOSIS — K219 Gastro-esophageal reflux disease without esophagitis: Secondary | ICD-10-CM | POA: Diagnosis present

## 2020-11-03 DIAGNOSIS — Z794 Long term (current) use of insulin: Secondary | ICD-10-CM | POA: Diagnosis not present

## 2020-11-03 DIAGNOSIS — U071 COVID-19: Principal | ICD-10-CM

## 2020-11-03 DIAGNOSIS — T380X5A Adverse effect of glucocorticoids and synthetic analogues, initial encounter: Secondary | ICD-10-CM | POA: Diagnosis not present

## 2020-11-03 DIAGNOSIS — I110 Hypertensive heart disease with heart failure: Secondary | ICD-10-CM | POA: Diagnosis present

## 2020-11-03 LAB — COMPREHENSIVE METABOLIC PANEL
ALT: 57 U/L — ABNORMAL HIGH (ref 0–44)
AST: 68 U/L — ABNORMAL HIGH (ref 15–41)
Albumin: 3.4 g/dL — ABNORMAL LOW (ref 3.5–5.0)
Alkaline Phosphatase: 57 U/L (ref 38–126)
Anion gap: 11 (ref 5–15)
BUN: 16 mg/dL (ref 8–23)
CO2: 25 mmol/L (ref 22–32)
Calcium: 8.3 mg/dL — ABNORMAL LOW (ref 8.9–10.3)
Chloride: 97 mmol/L — ABNORMAL LOW (ref 98–111)
Creatinine, Ser: 0.82 mg/dL (ref 0.44–1.00)
GFR, Estimated: >60 mL/min (ref >60)
Glucose, Bld: 260 mg/dL — ABNORMAL HIGH (ref 70–99)
Potassium: 3.7 mmol/L (ref 3.5–5.1)
Sodium: 133 mmol/L — ABNORMAL LOW (ref 135–145)
Total Bilirubin: 0.6 mg/dL (ref 0.3–1.2)
Total Protein: 7.6 g/dL (ref 6.5–8.1)

## 2020-11-03 LAB — C-REACTIVE PROTEIN: CRP: 2.9 mg/dL — ABNORMAL HIGH (ref <1.0)

## 2020-11-03 LAB — CBC WITH DIFFERENTIAL/PLATELET
Abs Immature Granulocytes: 0.02 10*3/uL (ref 0.00–0.07)
Basophils Absolute: 0 10*3/uL (ref 0.0–0.1)
Basophils Relative: 0 %
Eosinophils Absolute: 0 10*3/uL (ref 0.0–0.5)
Eosinophils Relative: 0 %
HCT: 44.9 % (ref 36.0–46.0)
Hemoglobin: 15.2 g/dL — ABNORMAL HIGH (ref 12.0–15.0)
Immature Granulocytes: 1 %
Lymphocytes Relative: 39 %
Lymphs Abs: 1.3 10*3/uL (ref 0.7–4.0)
MCH: 31.5 pg (ref 26.0–34.0)
MCHC: 33.9 g/dL (ref 30.0–36.0)
MCV: 93.2 fL (ref 80.0–100.0)
Monocytes Absolute: 0.2 10*3/uL (ref 0.1–1.0)
Monocytes Relative: 5 %
Neutro Abs: 1.9 10*3/uL (ref 1.7–7.7)
Neutrophils Relative %: 55 %
Platelets: 143 10*3/uL — ABNORMAL LOW (ref 150–400)
RBC: 4.82 MIL/uL (ref 3.87–5.11)
RDW: 12.4 % (ref 11.5–15.5)
WBC: 3.4 10*3/uL — ABNORMAL LOW (ref 4.0–10.5)
nRBC: 0 % (ref 0.0–0.2)

## 2020-11-03 LAB — HEPATITIS PANEL, ACUTE
HCV Ab: NONREACTIVE
Hep A IgM: NONREACTIVE
Hep B C IgM: NONREACTIVE
Hepatitis B Surface Ag: NONREACTIVE

## 2020-11-03 LAB — FERRITIN: Ferritin: 1436 ng/mL — ABNORMAL HIGH (ref 11–307)

## 2020-11-03 LAB — CULTURE, BLOOD (ROUTINE X 2)

## 2020-11-03 LAB — GLUCOSE, CAPILLARY
Glucose-Capillary: 272 mg/dL — ABNORMAL HIGH (ref 70–99)
Glucose-Capillary: 325 mg/dL — ABNORMAL HIGH (ref 70–99)
Glucose-Capillary: 265 mg/dL — ABNORMAL HIGH (ref 70–99)
Glucose-Capillary: 308 mg/dL — ABNORMAL HIGH (ref 70–99)

## 2020-11-03 LAB — HEMOGLOBIN A1C
Hgb A1c MFr Bld: 8.4 % — ABNORMAL HIGH (ref 4.8–5.6)
Hgb A1c MFr Bld: 8.6 % — ABNORMAL HIGH (ref 4.8–5.6)
Mean Plasma Glucose: 194.38 mg/dL
Mean Plasma Glucose: 200.12 mg/dL

## 2020-11-03 LAB — D-DIMER, QUANTITATIVE: D-Dimer, Quant: 0.84 ug/mL-FEU — ABNORMAL HIGH (ref 0.00–0.50)

## 2020-11-03 LAB — MAGNESIUM: Magnesium: 2.2 mg/dL (ref 1.7–2.4)

## 2020-11-03 LAB — LACTIC ACID, PLASMA: Lactic Acid, Venous: 1.4 mmol/L (ref 0.5–1.9)

## 2020-11-03 MED ORDER — INSULIN GLARGINE 100 UNIT/ML ~~LOC~~ SOLN
30.0000 [IU] | Freq: Every day | SUBCUTANEOUS | Status: DC
  Administered 2020-11-03: 30 [IU] via SUBCUTANEOUS
  Filled 2020-11-03 (×2): qty 0.3

## 2020-11-03 MED ORDER — ENOXAPARIN SODIUM 40 MG/0.4ML ~~LOC~~ SOLN
40.0000 mg | Freq: Every day | SUBCUTANEOUS | Status: DC
  Administered 2020-11-03 – 2020-11-07 (×5): 40 mg via SUBCUTANEOUS
  Filled 2020-11-03 (×5): qty 0.4

## 2020-11-03 MED ORDER — IPRATROPIUM-ALBUTEROL 20-100 MCG/ACT IN AERS
1.0000 | INHALATION_SPRAY | Freq: Two times a day (BID) | RESPIRATORY_TRACT | Status: DC
  Administered 2020-11-04 – 2020-11-07 (×7): 1 via RESPIRATORY_TRACT
  Filled 2020-11-03: qty 4

## 2020-11-03 MED ORDER — ASCORBIC ACID 500 MG PO TABS
500.0000 mg | ORAL_TABLET | Freq: Every day | ORAL | Status: DC
  Administered 2020-11-03 – 2020-11-07 (×5): 500 mg via ORAL
  Filled 2020-11-03 (×5): qty 1

## 2020-11-03 MED ORDER — ZINC SULFATE 220 (50 ZN) MG PO CAPS
220.0000 mg | ORAL_CAPSULE | Freq: Every day | ORAL | Status: DC
  Administered 2020-11-03 – 2020-11-07 (×5): 220 mg via ORAL
  Filled 2020-11-03 (×5): qty 1

## 2020-11-03 MED ORDER — SODIUM CHLORIDE 0.9 % IV SOLN: Freq: Once | INTRAVENOUS | Status: AC

## 2020-11-03 MED ORDER — GUAIFENESIN-DM 100-10 MG/5ML PO SYRP
10.0000 mL | ORAL_SOLUTION | ORAL | Status: DC | PRN
  Administered 2020-11-06: 10 mL via ORAL
  Filled 2020-11-03 (×2): qty 10

## 2020-11-03 MED ORDER — ACETAMINOPHEN 325 MG PO TABS
650.0000 mg | ORAL_TABLET | Freq: Four times a day (QID) | ORAL | Status: DC | PRN
  Administered 2020-11-03: 650 mg via ORAL
  Filled 2020-11-03: qty 2

## 2020-11-03 MED ORDER — CHLORTHALIDONE 25 MG PO TABS
25.0000 mg | ORAL_TABLET | Freq: Every day | ORAL | Status: DC
  Administered 2020-11-03 – 2020-11-06 (×4): 25 mg via ORAL
  Filled 2020-11-03 (×4): qty 1

## 2020-11-03 MED ORDER — ATENOLOL 50 MG PO TABS
50.0000 mg | ORAL_TABLET | Freq: Every day | ORAL | Status: DC
  Administered 2020-11-03 – 2020-11-07 (×5): 50 mg via ORAL
  Filled 2020-11-03 (×5): qty 1

## 2020-11-03 MED ORDER — ATENOLOL-CHLORTHALIDONE 50-25 MG PO TABS: 1.0000 | ORAL_TABLET | Freq: Every day | ORAL | Status: DC

## 2020-11-03 MED ORDER — IPRATROPIUM-ALBUTEROL 20-100 MCG/ACT IN AERS
1.0000 | INHALATION_SPRAY | Freq: Four times a day (QID) | RESPIRATORY_TRACT | Status: DC
  Administered 2020-11-03 (×3): 1 via RESPIRATORY_TRACT
  Filled 2020-11-03: qty 4

## 2020-11-03 MED ORDER — ONDANSETRON HCL 4 MG/2ML IJ SOLN: 4.0000 mg | Freq: Four times a day (QID) | INTRAMUSCULAR | Status: DC | PRN

## 2020-11-03 MED ORDER — HYDROCOD POLST-CPM POLST ER 10-8 MG/5ML PO SUER: 5.0000 mL | Freq: Two times a day (BID) | ORAL | Status: DC | PRN

## 2020-11-03 MED ORDER — ACETAMINOPHEN 650 MG RE SUPP: 650.0000 mg | Freq: Four times a day (QID) | RECTAL | Status: DC | PRN

## 2020-11-03 MED ORDER — ADULT MULTIVITAMIN W/MINERALS CH
1.0000 | ORAL_TABLET | Freq: Every day | ORAL | Status: DC
  Administered 2020-11-03 – 2020-11-07 (×5): 1 via ORAL
  Filled 2020-11-03 (×5): qty 1

## 2020-11-03 MED ORDER — PANTOPRAZOLE SODIUM 40 MG PO TBEC
40.0000 mg | DELAYED_RELEASE_TABLET | Freq: Every day | ORAL | Status: DC
Start: 2020-11-03 — End: 2020-11-07
  Administered 2020-11-03 – 2020-11-07 (×5): 40 mg via ORAL
  Filled 2020-11-03 (×5): qty 1

## 2020-11-03 MED ORDER — ONDANSETRON HCL 4 MG PO TABS: 4.0000 mg | ORAL_TABLET | Freq: Four times a day (QID) | ORAL | Status: DC | PRN

## 2020-11-03 NOTE — H&P (Signed)
History and Physical    rehema muffley [facility] JOA:[id number] DOB: 16-Sep-1941 DOA: 11/02/2020  PCP: Pcp, No  Patient coming from: [facility]  Chief Complaint: Dyspnea  HPI: Wanda Johnston is a 80 y.o. female with medical history significant of HTN, DM2, HLD. Presenting with dyspnea for 1 week. She reports concomitant cough, N/V, and fever. She tried advil, but it did not provide any help. She notes exposure to COVID positive family members. Her symptoms were progressing to the point that she was struggling to catch her breath at rest or ambulating. Her family checked her O2 sat at home and found it to range between mid-70's and high-80's. She was taken for a outpt COVID test and it was found to be positive. She then came to the ED. She denies any other alleviating or aggravating factors.     Interview w/ aid of interpreter at bedside.  ED Course: Found to have new O2 requirement and COVID positive. Started on steroids, remdes. TRH called for admission.   Review of Systems:  Denies CP, palpitations, syncope, leg swelling. Review of systems is otherwise negative for all not mentioned in HPI.   PMHx Past Medical History:  Diagnosis Date  . CHF (congestive heart failure) (HCC)   . Diabetes mellitus without complication (HCC)   . Hypertension     PSHx Past Surgical History:  Procedure Laterality Date  . EYE SURGERY      SocHx  reports that she has never smoked. She does not have any smokeless tobacco history on file. She reports that she does not drink alcohol and does not use drugs.  No Known Allergies  FamHx History reviewed. No pertinent family history.  Prior to Admission medications   Medication Sig Start Date End Date Taking? Authorizing Provider  traMADol (ULTRAM) 50 MG tablet Take 1 tablet (50 mg total) by mouth every 6 (six) hours as needed for pain. 08/14/13   Rodolph Bong, MD    Physical Exam: Vitals:   11/03/20 0100 11/03/20 0200 11/03/20 0300 11/03/20 0628  BP: 140/79 119/69  117/72 (!) 143/72  Pulse: 74 70 73 75  Resp: (!) 24 (!) 22 (!) 23 (!) 22  Temp:    97.9 F (36.6 C)  TempSrc:      SpO2: 93% 95% 96% 92%  Weight:      Height:        General: 80 y.o. female resting in bed in NAD Eyes: PERRL, normal sclera ENMT: Nares patent w/o discharge, orophaynx clear, dentition normal, ears w/o discharge/lesions/ulcers Neck: Supple, trachea midline Cardiovascular: RRR, +S1, S2, no m/g/r, equal pulses throughout Respiratory: decreased at bases, no w/r/r, normal WOB on 2L GI: BS+, NDNT, no masses noted, no organomegaly noted MSK: No e/c/c; chronic skin changes b/l ankles w/ some patchy scaling on both ankles Skin: No rashes, bruises, ulcerations noted Neuro: A&O x 3, no focal deficits Psyc: Appropriate interaction and affect, calm/cooperative  Labs on Admission: I have personally reviewed following labs and imaging studies  CBC: Recent Labs  Lab 11/02/20 1733  WBC 6.1  NEUTROABS 2.0  HGB 14.5  HCT 42.6  MCV 92.0  PLT 154   Basic Metabolic Panel: Recent Labs  Lab 11/02/20 1733  NA 130*  K 2.8*  CL 92*  CO2 26  GLUCOSE 175*  BUN 13  CREATININE 0.86  CALCIUM 8.1*  MG 1.6*   GFR: Estimated Creatinine Clearance: 45.6 mL/min (by C-G formula based on SCr of 0.86 mg/dL). Liver Function Tests: Recent Labs  Lab 11/02/20 1733  AST 85*  ALT 61*  ALKPHOS 56  BILITOT 0.6  PROT 7.5  ALBUMIN 3.5   No results for input(s): LIPASE, AMYLASE in the last 168 hours. No results for input(s): AMMONIA in the last 168 hours. Coagulation Profile: No results for input(s): INR, PROTIME in the last 168 hours. Cardiac Enzymes: No results for input(s): CKTOTAL, CKMB, CKMBINDEX, TROPONINI in the last 168 hours. BNP (last 3 results) No results for input(s): PROBNP in the last 8760 hours. HbA1C: No results for input(s): HGBA1C in the last 72 hours. CBG: Recent Labs  Lab 11/02/20 2023 11/02/20 2203 11/02/20 2347 11/03/20 0730  GLUCAP 175* 313* 283* 272*    Lipid Profile: Recent Labs    11/02/20 1733  TRIG 208*   Thyroid Function Tests: No results for input(s): TSH, T4TOTAL, FREET4, T3FREE, THYROIDAB in the last 72 hours. Anemia Panel: Recent Labs    11/02/20 1733  FERRITIN 1,467*   Urine analysis: No results found for: COLORURINE, APPEARANCEUR, LABSPEC, PHURINE, GLUCOSEU, HGBUR, BILIRUBINUR, KETONESUR, PROTEINUR, UROBILINOGEN, NITRITE, LEUKOCYTESUR  Radiological Exams on Admission: DG Chest Port 1 View  Result Date: 11/02/2020 CLINICAL DATA:  Shortness of breath, COVID positive. EXAM: PORTABLE CHEST 1 VIEW COMPARISON:  None. FINDINGS: Mild, diffuse chronic appearing increased interstitial lung markings are seen. There is no evidence of a pleural effusion or pneumothorax. The cardiac silhouette is borderline in size. There is mild calcification of the aortic arch. The visualized skeletal structures are unremarkable. IMPRESSION: Chronic appearing increased interstitial lung markings without acute infiltrate. Electronically Signed   By: Aram Candela M.D.   On: 11/02/2020 17:44    EKG: Independently reviewed. NSR, no st elevations  Assessment/Plan COVID 19 PNA     - admit to inpt, tele     - steroids, remdes, inhalers, anti-tussives, IS, FV     - currently on 2L [place], wean O2 as able     - follow inflammatory markers  DM2     - lantus, SSI, DM diet, A1c  HTN     - continue tenoretic  GERD     - continue protonix  HLD     - hold statin d/t elevated LFTs  Hypokalemia Hypomagnesemia Hyponatremia     - fluids, replace K+/Mg2+; follow  Elevated LFTs     - check hepatitis panel     - US abdomen  DVT prophylaxis: lovenox  Code Status: FULL Family Communication: None at bedside.  Consults called: None   Status is: Inpatient  Remains inpatient appropriate because:Inpatient level of care appropriate due to severity of illness   Dispo: The patient is from: Home              Anticipated d/c is to: Home               Anticipated d/c date is: 3 days              Patient currently is not medically stable to d/c.  Teddy Spike DO [facility]  If 7PM-7AM, please contact night-coverage [email or web address]  11/03/2020, 7:43 AM

## 2020-11-03 NOTE — Progress Notes (Signed)
Called and updated daughter Seward Grater [phone] regarding the transfer. Daughter able to explain to patient regarding plan of care and reason for transfer. Both verbalized understanding.

## 2020-11-03 NOTE — Plan of Care (Signed)
  Problem: Education: Goal: Knowledge of General Education information will improve Description: Including pain rating scale, medication(s)/side effects and non-pharmacologic comfort measures Outcome: Progressing   Problem: Health Behavior/Discharge Planning: Goal: Ability to manage health-related needs will improve Outcome: Progressing   Problem: Clinical Measurements: Goal: Will remain free from infection Outcome: Progressing Goal: Respiratory complications will improve Outcome: Progressing   Problem: Activity: Goal: Risk for activity intolerance will decrease Outcome: Progressing   Problem: Nutrition: Goal: Adequate nutrition will be maintained Outcome: Progressing   Problem: Coping: Goal: Level of anxiety will decrease Outcome: Progressing   Utilized interpreter for education regarding plan of care.

## 2020-11-03 NOTE — Progress Notes (Signed)
Due to language barrier- RT instructed RN (translates) concerning Flutter device.

## 2020-11-04 DIAGNOSIS — J9601 Acute respiratory failure with hypoxia: Secondary | ICD-10-CM | POA: Diagnosis not present

## 2020-11-04 LAB — CBC WITH DIFFERENTIAL/PLATELET
Abs Immature Granulocytes: 0.04 10*3/uL (ref 0.00–0.07)
Basophils Absolute: 0 10*3/uL (ref 0.0–0.1)
Basophils Relative: 0 %
Eosinophils Absolute: 0 10*3/uL (ref 0.0–0.5)
Eosinophils Relative: 0 %
HCT: 42.5 % (ref 36.0–46.0)
Hemoglobin: 14.2 g/dL (ref 12.0–15.0)
Immature Granulocytes: 1 %
Lymphocytes Relative: 21 %
Lymphs Abs: 1.7 10*3/uL (ref 0.7–4.0)
MCH: 31.3 pg (ref 26.0–34.0)
MCHC: 33.4 g/dL (ref 30.0–36.0)
MCV: 93.6 fL (ref 80.0–100.0)
Monocytes Absolute: 0.5 10*3/uL (ref 0.1–1.0)
Monocytes Relative: 7 %
Neutro Abs: 5.9 10*3/uL (ref 1.7–7.7)
Neutrophils Relative %: 71 %
Platelets: 170 10*3/uL (ref 150–400)
RBC: 4.54 MIL/uL (ref 3.87–5.11)
RDW: 12.6 % (ref 11.5–15.5)
WBC: 8.2 10*3/uL (ref 4.0–10.5)
nRBC: 0 % (ref 0.0–0.2)

## 2020-11-04 LAB — COMPREHENSIVE METABOLIC PANEL
ALT: 44 U/L (ref 0–44)
AST: 47 U/L — ABNORMAL HIGH (ref 15–41)
Albumin: 3.2 g/dL — ABNORMAL LOW (ref 3.5–5.0)
Alkaline Phosphatase: 49 U/L (ref 38–126)
Anion gap: 10 (ref 5–15)
BUN: 28 mg/dL — ABNORMAL HIGH (ref 8–23)
CO2: 27 mmol/L (ref 22–32)
Calcium: 8.4 mg/dL — ABNORMAL LOW (ref 8.9–10.3)
Chloride: 101 mmol/L (ref 98–111)
Creatinine, Ser: 0.94 mg/dL (ref 0.44–1.00)
GFR, Estimated: >60 mL/min (ref >60)
Glucose, Bld: 270 mg/dL — ABNORMAL HIGH (ref 70–99)
Potassium: 3.4 mmol/L — ABNORMAL LOW (ref 3.5–5.1)
Sodium: 138 mmol/L (ref 135–145)
Total Bilirubin: 0.3 mg/dL (ref 0.3–1.2)
Total Protein: 7.1 g/dL (ref 6.5–8.1)

## 2020-11-04 LAB — GLUCOSE, CAPILLARY
Glucose-Capillary: 213 mg/dL — ABNORMAL HIGH (ref 70–99)
Glucose-Capillary: 249 mg/dL — ABNORMAL HIGH (ref 70–99)
Glucose-Capillary: 177 mg/dL — ABNORMAL HIGH (ref 70–99)
Glucose-Capillary: 184 mg/dL — ABNORMAL HIGH (ref 70–99)

## 2020-11-04 LAB — PHOSPHORUS: Phosphorus: 2.6 mg/dL (ref 2.5–4.6)

## 2020-11-04 LAB — FERRITIN: Ferritin: 1130 ng/mL — ABNORMAL HIGH (ref 11–307)

## 2020-11-04 LAB — MAGNESIUM: Magnesium: 2.4 mg/dL (ref 1.7–2.4)

## 2020-11-04 LAB — D-DIMER, QUANTITATIVE: D-Dimer, Quant: 0.5 ug/mL-FEU (ref 0.00–0.50)

## 2020-11-04 LAB — C-REACTIVE PROTEIN: CRP: 1.6 mg/dL — ABNORMAL HIGH (ref <1.0)

## 2020-11-04 MED ORDER — INSULIN GLARGINE 100 UNIT/ML ~~LOC~~ SOLN
34.0000 [IU] | Freq: Every day | SUBCUTANEOUS | Status: DC
  Administered 2020-11-04 – 2020-11-06 (×3): 34 [IU] via SUBCUTANEOUS
  Filled 2020-11-04 (×4): qty 0.34

## 2020-11-04 NOTE — Progress Notes (Signed)
**Note Wanda-Identified via Obfuscation** Inpatient Diabetes Program Recommendations  AACE/ADA: New Consensus Statement on Inpatient Glycemic Control (2015)  Target Ranges:  Prepandial:   less than 140 mg/dL      Peak postprandial:   less than 180 mg/dL (1-2 hours)      Critically ill patients:  140 - 180 mg/dL   Lab Results  Component Value Date   GLUCAP 249 (H) 11/04/2020   HGBA1C 8.4 (H) 11/03/2020   HGBA1C 8.6 (H) 11/03/2020    Review of Glycemic Control Results for Wanda Johnston, Wanda Johnston (MRN [id number]) as of 11/04/2020 12:47  Ref. Range 11/03/2020 21:50 11/04/2020 07:32 11/04/2020 11:57  Glucose-Capillary Latest Ref Range: 70 - 99 mg/dL 616 (H) 837 (H) 290 (H)   Diabetes history: Type 2 Dm Outpatient Diabetes medications: Lantus 30 units QHS, Metformin 500 mg QD Current orders for Inpatient glycemic control: Lantus 30 units QHS, Novolog 0-15 units TID, Novolog 0-5 units QHS Solumedrol 80 mg QD Inpatient Diabetes Program Recommendations:    Consider increasing Lantus to 34 units QHS.   Thanks, Lujean Rave, MSN, RNC-OB Diabetes Coordinator [phone] (8a-5p)

## 2020-11-04 NOTE — Plan of Care (Signed)
  Problem: Education: Goal: Knowledge of General Education information will improve Description: Including pain rating scale, medication(s)/side effects and non-pharmacologic comfort measures Outcome: Progressing   Problem: Health Behavior/Discharge Planning: Goal: Ability to manage health-related needs will improve Outcome: Progressing   Problem: Clinical Measurements: Goal: Ability to maintain clinical measurements within normal limits will improve Outcome: Progressing Goal: Will remain free from infection Outcome: Progressing Goal: Diagnostic test results will improve Outcome: Progressing Goal: Respiratory complications will improve Outcome: Progressing Goal: Cardiovascular complication will be avoided Outcome: Progressing   Problem: Activity: Goal: Risk for activity intolerance will decrease Outcome: Progressing   Problem: Nutrition: Goal: Adequate nutrition will be maintained Outcome: Progressing   Problem: Coping: Goal: Level of anxiety will decrease Outcome: Progressing   Problem: Elimination: Goal: Will not experience complications related to bowel motility Outcome: Progressing Goal: Will not experience complications related to urinary retention Outcome: Progressing   Problem: Pain Managment: Goal: General experience of comfort will improve Outcome: Progressing   Problem: Skin Integrity: Goal: Risk for impaired skin integrity will decrease Outcome: Progressing   Problem: Education: Goal: Knowledge of risk factors and measures for prevention of condition will improve Outcome: Progressing   Problem: Coping: Goal: Psychosocial and spiritual needs will be supported Outcome: Progressing   Problem: Respiratory: Goal: Will maintain a patent airway Outcome: Progressing Goal: Complications related to the disease process, condition or treatment will be avoided or minimized Outcome: Progressing   

## 2020-11-04 NOTE — Progress Notes (Signed)
PROGRESS NOTE    Wanda Johnston  TDD:[id number] DOB: [birth year] DOA: 11/02/2020 PCP: Jonathon Jordan, MD  Brief Narrative:  Wanda Johnston is a 80 y.o. female with medical history significant of HTN, DM2, HLD. Presenting with dyspnea for 1 week. She reports concomitant cough, N/V, and fever. She tried advil, but it did not provide any help. She notes exposure to COVID positive family members. Her symptoms were progressing to the point that she was struggling to catch her breath at rest or ambulating. Her family checked her O2 sat at home and found it to range between mid-70's and high-80's. She was taken for a outpt COVID test and it was found to be positive. She then came to the ED. She denies any other alleviating or aggravating factors.  Patient found to be COVID positive.  Patient speaks Filipino and some English  Assessment & Plan:   Active Problems:   Acute respiratory failure with hypoxia (HCC)   COVID 19 PNA     - admit to inpt, tele     - steroids, remdes, inhalers, anti-tussives, IS, FV     - currently on 2L [place], wean O2 as able     - follow inflammatory markers 11/04/20 Remains on O2 supplementation. Will continue current regimen, wean off O2 supplementation as able.   DM2     - lantus, SSI, DM diet, A1c 11/04/20 Blood sugar elevated, hyperglycemia likely secondary to steroids use. Will increase Lantus dose to 34 units and monitor.   HTN     - continue tenoretic   GERD     - continue protonix  HLD     - hold statin d/t elevated LFTs  Hypokalemia Hypomagnesemia Hyponatremia     - fluids, replace K+/Mg2+; follow  Elevated LFTs     - check hepatitis panel     - US abdomen 11/04/20 [place] abd with mildly echogenic liver and small gallbladder polyp, may need repeat US abd in 1 yr for follow up. Hepatitis panel negative. LFTs improving. Will continue monitoring.     DVT prophylaxis: Lovenox Code Status: Full Family Communication: None Disposition Plan:  Patient from  home                                 Anticipate dc to home in 3-4 days                                 Patient not medically ready for discharge                              .   Consultants:   None    Subjective: The patient reports feeling better. Declined having interpreter, speaking some English. Offered interpreter again but declined.  She is hungry, denies nausea. Continues to have a cough. Remains on O2 supplementation of 2lpm via [place].   Objective: Vitals:   11/03/20 1947 11/04/20 0532 11/04/20 0800 11/04/20 1436  BP: 118/68 123/69  125/81  Pulse: 69 60  62  Resp: 19   20  Temp: 98.1 F (36.7 C) 98.1 F (36.7 C)  98 F (36.7 C)  TempSrc: Oral Oral  Oral  SpO2: 96% 92% 92% 98%  Weight:      Height:        Intake/Output Summary (Last 24  hours) at 11/04/2020 1647 Last data filed at 11/04/2020 1436 Gross per 24 hour  Intake 1071 ml  Output --  Net 1071 ml   Filed Weights   11/02/20 1703  Weight: 63.5 kg    Examination:  General exam: Appears calm and comfortable  Respiratory system: No cough, normal effort Cardiovascular system: S1 & S2 present, RRR.  Gastrointestinal system: Abdomen is nondistended, soft and nontender.  Central nervous system: Alert and oriented. No focal neurological deficits. Extremities: Symmetric 5 x 5 power. Skin: No rashes, lesions or ulcers Psychiatry:  Mood & affect appropriate.     Data Reviewed: I have personally reviewed following labs and imaging studies reports  CBC: Recent Labs  Lab 11/02/20 1733 11/03/20 0857 11/04/20 0411  WBC 6.1 3.4* 8.2  NEUTROABS 2.0 1.9 5.9  HGB 14.5 15.2* 14.2  HCT 42.6 44.9 42.5  MCV 92.0 93.2 93.6  PLT 154 143* 170   Basic Metabolic Panel: Recent Labs  Lab 11/02/20 1733 11/03/20 0857 11/04/20 0411  NA 130* 133* 138  K 2.8* 3.7 3.4*  CL 92* 97* 101  CO2 26 25 27   GLUCOSE 175* 260* 270*  BUN 13 16 28*  CREATININE 0.86 0.82 0.94  CALCIUM 8.1* 8.3* 8.4*  MG 1.6* 2.2 2.4  PHOS  --    --  2.6   GFR: Estimated Creatinine Clearance: 41.7 mL/min (by C-G formula based on SCr of 0.94 mg/dL). Liver Function Tests: Recent Labs  Lab 11/02/20 1733 11/03/20 0857 11/04/20 0411  AST 85* 68* 47*  ALT 61* 57* 44  ALKPHOS 56 57 49  BILITOT 0.6 0.6 0.3  PROT 7.5 7.6 7.1  ALBUMIN 3.5 3.4* 3.2*   No results for input(s): LIPASE, AMYLASE in the last 168 hours. No results for input(s): AMMONIA in the last 168 hours. Coagulation Profile: No results for input(s): INR, PROTIME in the last 168 hours. Cardiac Enzymes: No results for input(s): CKTOTAL, CKMB, CKMBINDEX, TROPONINI in the last 168 hours. BNP (last 3 results) No results for input(s): PROBNP in the last 8760 hours. HbA1C: Recent Labs    11/03/20 0857  HGBA1C 8.6*  8.4*   CBG: Recent Labs  Lab 11/03/20 1744 11/03/20 2150 11/04/20 0732 11/04/20 1157 11/04/20 1613  GLUCAP 265* 308* 213* 249* 177*   Lipid Profile: Recent Labs    11/02/20 1733  TRIG 208*   Thyroid Function Tests: No results for input(s): TSH, T4TOTAL, FREET4, T3FREE, THYROIDAB in the last 72 hours. Anemia Panel: Recent Labs    11/03/20 0857 11/04/20 0411  FERRITIN 1,436* 1,130*   Sepsis Labs: Recent Labs  Lab 11/02/20 1733 11/03/20 0857  PROCALCITON <0.10  --   LATICACIDVEN 1.4 1.4    Recent Results (from the past 240 hour(s))  Resp Panel by RT-PCR (Flu A&B, Covid) Peripheral     Status: Abnormal   Collection Time: 11/02/20  5:33 PM   Specimen: Peripheral; Nasopharyngeal(NP) swabs in vial transport medium  Result Value Ref Range Status   SARS Coronavirus 2 by RT PCR POSITIVE (A) NEGATIVE Final    Comment: RESULT CALLED TO, READ BACK BY AND VERIFIED WITH: 12/31/20, RN AT 1849 ON [place] BY BOWLBY, J (NOTE) SARS-CoV-2 target nucleic acids are DETECTED.  The SARS-CoV-2 RNA is generally detectable in upper respiratory specimens during the acute phase of infection. Positive results are indicative of the presence of the  identified virus, but do not rule out bacterial infection or co-infection with other pathogens not detected by the test. Clinical correlation with  patient history and other diagnostic information is necessary to determine patient infection status. The expected result is Negative.  Fact Sheet for Patients: BloggerCourse.com  Fact Sheet for Healthcare Providers: SeriousBroker.it  This test is not yet approved or cleared by the [place] FDA and  has been authorized for detection and/or diagnosis of SARS-CoV-2 by FDA under an Emergency Use Authorization (EUA).  This EUA will remain in effect (meaning thi s test can be used) for the duration of  the COVID-19 declaration under Section 564(b)(1) of the Act, 21 U.S.C. section 360bbb-3(b)(1), unless the authorization is terminated or revoked sooner.     Influenza A by PCR NEGATIVE NEGATIVE Final   Influenza B by PCR NEGATIVE NEGATIVE Final    Comment: (NOTE) The Xpert Xpress SARS-CoV-2/FLU/RSV plus assay is intended as an aid in the diagnosis of influenza from Nasopharyngeal swab specimens and should not be used as a sole basis for treatment. Nasal washings and aspirates are unacceptable for Xpert Xpress SARS-CoV-2/FLU/RSV testing.  Fact Sheet for Patients: BloggerCourse.com  Fact Sheet for Healthcare Providers: SeriousBroker.it  This test is not yet approved or cleared by the [place] FDA and has been authorized for detection and/or diagnosis of SARS-CoV-2 by FDA under an Emergency Use Authorization (EUA). This EUA will remain in effect (meaning this test can be used) for the duration of the COVID-19 declaration under Section 564(b)(1) of the Act, 21 U.S.C. section 360bbb-3(b)(1), unless the authorization is terminated or revoked.  Performed at [facility], [address] [place], [place] [address]   Blood  Culture (routine x 2)     Status: None (Preliminary result)   Collection Time: 11/02/20  5:34 PM   Specimen: BLOOD  Result Value Ref Range Status   Specimen Description   Final    BLOOD RIGHT ANTECUBITAL Performed at [facility], [address] [place], [place] [address]    Special Requests   Final    BOTTLES DRAWN AEROBIC AND ANAEROBIC Blood Culture adequate volume Performed at [facility], [address] [place], [place] [address]    Culture   Final    NO GROWTH < 12 HOURS Performed at [facility], [address] [place], [place] [address]    Report Status PENDING  Incomplete  Blood Culture (routine x 2)     Status: None (Preliminary result)   Collection Time: 11/02/20  5:35 PM   Specimen: BLOOD LEFT FOREARM  Result Value Ref Range Status   Specimen Description   Final    BLOOD LEFT FOREARM Performed at [facility], [address] [place], [place] [address]    Special Requests   Final    BOTTLES DRAWN AEROBIC AND ANAEROBIC Blood Culture adequate volume Performed at [facility], [address] [place], [place] [address]    Culture   Final    NO GROWTH < 12 HOURS Performed at [facility], [address] [place], [place] [address]    Report Status PENDING  Incomplete         Radiology Studies: DG Chest Port 1 View  Result Date: 11/02/2020 CLINICAL DATA:  Shortness of breath, COVID positive. EXAM: PORTABLE CHEST 1 VIEW COMPARISON:  None. FINDINGS: Mild, diffuse chronic appearing increased interstitial lung markings are seen. There is no evidence of a pleural effusion or pneumothorax. The cardiac silhouette is borderline in size. There is mild calcification of the aortic arch. The  visualized skeletal structures are unremarkable. IMPRESSION: Chronic appearing increased interstitial lung markings without acute infiltrate. Electronically Signed   By: Aram Candela M.D.   On: 11/02/2020 17:44   US Abdomen Limited  RUQ (LIVER/GB)  Result Date: 11/03/2020 CLINICAL DATA:  Elevated liver function tests. EXAM: ULTRASOUND ABDOMEN LIMITED RIGHT UPPER QUADRANT COMPARISON:  None. FINDINGS: Gallbladder: No gallstones or wall thickening visualized. 4 mm nonshadowing echogenic focus projecting from the gallbladder wall into the lumen suggestive of a polyp. No sonographic Murphy sign noted by sonographer. Common bile duct: Diameter: 8 mm, upper limits of normal/borderline dilated for age Liver: Mildly increased parenchymal echogenicity diffusely without a focal lesion identified. Portal vein is patent on color Doppler imaging with normal direction of blood flow towards the liver. Other: None. IMPRESSION: 1. Mildly echogenic liver, nonspecific though may reflect steatosis. 2. No gallstones, evidence of cholecystitis, or significant biliary dilatation. 3. 4 mm gallbladder polyp. Electronically Signed   By: Sebastian Ache M.D.   On: 11/03/2020 15:55        Scheduled Meds: . vitamin C  500 mg Oral Daily  . atenolol  50 mg Oral Daily   And  . chlorthalidone  25 mg Oral Daily  . enoxaparin (LOVENOX) injection  40 mg Subcutaneous Daily  . insulin aspart  0-15 Units Subcutaneous TID WC  . insulin aspart  0-5 Units Subcutaneous QHS  . insulin glargine  34 Units Subcutaneous QHS  . Ipratropium-Albuterol  1 puff Inhalation BID  . methylPREDNISolone (SOLU-MEDROL) injection  80 mg Intravenous Q24H  . multivitamin with minerals  1 tablet Oral Daily  . pantoprazole  40 mg Oral Daily  . zinc sulfate  220 mg Oral Daily   Continuous Infusions: . remdesivir 100 mg in NS 100 mL    . remdesivir 100 mg in NS 100 mL 100 mg (11/04/20 1106)     LOS: 1 day    Time spent: 35 minutes including more than 50% of this time on patient care coordination.     Ky Barban, MD [facility]   If 7PM-7AM, please contact night-coverage [email or web address] Password [facility] 11/04/2020, 4:47 PM

## 2020-11-04 NOTE — Progress Notes (Signed)
SATURATION QUALIFICATIONS: (This note is used to comply with regulatory documentation for home oxygen)  Patient Saturations on Room Air at Rest = 92%  Patient Saturations on Room Air while Ambulating = 79%  Patient Saturations on 2 Liters of oxygen while Ambulating = 90%  Please briefly explain why patient needs home oxygen: desat with ambulation

## 2020-11-05 DIAGNOSIS — J9601 Acute respiratory failure with hypoxia: Secondary | ICD-10-CM | POA: Diagnosis not present

## 2020-11-05 LAB — D-DIMER, QUANTITATIVE: D-Dimer, Quant: 0.4 ug/mL-FEU (ref 0.00–0.50)

## 2020-11-05 LAB — CBC WITH DIFFERENTIAL/PLATELET
Abs Immature Granulocytes: 0.06 10*3/uL (ref 0.00–0.07)
Basophils Absolute: 0 10*3/uL (ref 0.0–0.1)
Basophils Relative: 0 %
Eosinophils Absolute: 0 10*3/uL (ref 0.0–0.5)
Eosinophils Relative: 0 %
HCT: 42.5 % (ref 36.0–46.0)
Hemoglobin: 14.1 g/dL (ref 12.0–15.0)
Immature Granulocytes: 1 %
Lymphocytes Relative: 11 %
Lymphs Abs: 1.2 10*3/uL (ref 0.7–4.0)
MCH: 30.9 pg (ref 26.0–34.0)
MCHC: 33.2 g/dL (ref 30.0–36.0)
MCV: 93.2 fL (ref 80.0–100.0)
Monocytes Absolute: 0.4 10*3/uL (ref 0.1–1.0)
Monocytes Relative: 4 %
Neutro Abs: 9.2 10*3/uL — ABNORMAL HIGH (ref 1.7–7.7)
Neutrophils Relative %: 84 %
Platelets: 195 10*3/uL (ref 150–400)
RBC: 4.56 MIL/uL (ref 3.87–5.11)
RDW: 12.6 % (ref 11.5–15.5)
WBC: 10.9 10*3/uL — ABNORMAL HIGH (ref 4.0–10.5)
nRBC: 0 % (ref 0.0–0.2)

## 2020-11-05 LAB — GLUCOSE, CAPILLARY
Glucose-Capillary: 248 mg/dL — ABNORMAL HIGH (ref 70–99)
Glucose-Capillary: 213 mg/dL — ABNORMAL HIGH (ref 70–99)
Glucose-Capillary: 159 mg/dL — ABNORMAL HIGH (ref 70–99)
Glucose-Capillary: 141 mg/dL — ABNORMAL HIGH (ref 70–99)

## 2020-11-05 LAB — COMPREHENSIVE METABOLIC PANEL
ALT: 37 U/L (ref 0–44)
AST: 40 U/L (ref 15–41)
Albumin: 3.2 g/dL — ABNORMAL LOW (ref 3.5–5.0)
Alkaline Phosphatase: 55 U/L (ref 38–126)
Anion gap: 14 (ref 5–15)
BUN: 30 mg/dL — ABNORMAL HIGH (ref 8–23)
CO2: 23 mmol/L (ref 22–32)
Calcium: 8.4 mg/dL — ABNORMAL LOW (ref 8.9–10.3)
Chloride: 100 mmol/L (ref 98–111)
Creatinine, Ser: 1.14 mg/dL — ABNORMAL HIGH (ref 0.44–1.00)
GFR, Estimated: 50 mL/min — ABNORMAL LOW (ref >60)
Glucose, Bld: 302 mg/dL — ABNORMAL HIGH (ref 70–99)
Potassium: 3.1 mmol/L — ABNORMAL LOW (ref 3.5–5.1)
Sodium: 137 mmol/L (ref 135–145)
Total Bilirubin: 0.6 mg/dL (ref 0.3–1.2)
Total Protein: 7 g/dL (ref 6.5–8.1)

## 2020-11-05 LAB — PHOSPHORUS: Phosphorus: 3.9 mg/dL (ref 2.5–4.6)

## 2020-11-05 LAB — FERRITIN: Ferritin: 1861 ng/mL — ABNORMAL HIGH (ref 11–307)

## 2020-11-05 LAB — CULTURE, BLOOD (ROUTINE X 2): Culture: NO GROWTH

## 2020-11-05 LAB — C-REACTIVE PROTEIN: CRP: 1 mg/dL — ABNORMAL HIGH (ref <1.0)

## 2020-11-05 LAB — MAGNESIUM: Magnesium: 2.3 mg/dL (ref 1.7–2.4)

## 2020-11-05 NOTE — Progress Notes (Signed)
PROGRESS NOTE    Wanda Johnston  ZRA:[id number] DOB: 1941-03-30 DOA: 11/02/2020 PCP: Mila Palmer, MD  Brief Narrative:  Wanda Johnston is a 80 y.o. female with medical history significant of HTN, DM2, HLD. Presenting with dyspnea for 1 week. She reports concomitant cough, N/V, and fever. She tried advil, but it did not provide any help. She notes exposure to COVID positive family members. Her symptoms were progressing to the point that she was struggling to catch her breath at rest or ambulating. Her family checked her O2 sat at home and found it to range between mid-70's and high-80's. She was taken for a outpt COVID test and it was found to be positive. She then came to the ED. She denies any other alleviating or aggravating factors.  Patient found to be COVID positive.  Patient speaks Filipino and some English  11/06/2019: Patient seen.  Patient is on IV remdesivir, and will complete current regimen tomorrow.  No worsening of shortness of breath.  Assessment & Plan:   Active Problems:   Acute respiratory failure with hypoxia (HCC)   COVID-19 pneumonia:   -Continue steroids, remdesivir, Combivent inhaler, anti-tussives. -Continue to check inflammatory markers -Continue other supportive measures. -Continue supplemental oxygen as needed.  Diabetes mellitus type 2: -Continue subcutaneous Lantus and sliding scale insulin coverage. -Continue to optimize.   Hypertension: -Controlled. -Continue to monitor electrolytes and renal function.  Have a low threshold to discontinue chlorthalidone.  GERD     - continue protonix  Hyperlipidemia:  -Statins on hold due to elevated liver function test.    Electrolyte abnormalities: Hypokalemia Hypomagnesemia Hyponatremia -Continue to monitor and replete.  Elevated LFTs -AST and ALT are back to normal range. -Acute hepatitis panel is nonreactive. -Abdominal ultrasound revealed  mildly echogenic liver and small gallbladder polyp   DVT  prophylaxis: Lovenox Code Status: Full Family Communication: None Disposition Plan:  Patient from home                                 Anticipate dc to home in 3-4 days                                 Patient not medically ready for discharge                              .   Consultants:   None    Subjective: No worsening of shortness of breath. Reports nausea.  Objective: Vitals:   11/04/20 0800 11/04/20 1436 11/04/20 2006 11/05/20 0452  BP:  125/81 129/72 121/69  Pulse:  62 (!) 59 (!) 57  Resp:  20 18 20   Temp:  98 F (36.7 C) 97.6 F (36.4 C) 97.7 F (36.5 C)  TempSrc:  Oral    SpO2: 92% 98% (!) 89% 95%  Weight:      Height:        Intake/Output Summary (Last 24 hours) at 11/05/2020 1314 Last data filed at 11/05/2020 1000 Gross per 24 hour  Intake 841 ml  Output --  Net 841 ml   Filed Weights   11/02/20 1703  Weight: 63.5 kg    Examination:  General exam: Patient is not in any distress.  Appears calm and comfortable  Respiratory system: Decreased air entry. Cardiovascular system: S1 & S2  Gastrointestinal system: Abdomen is nondistended, soft and nontender.  Central nervous system: Awake and alert.  Patient moves all extremities.   Extremities: No leg edema.  Data Reviewed: I have personally reviewed following labs and imaging studies reports  CBC: Recent Labs  Lab 11/02/20 1733 11/03/20 0857 11/04/20 0411 11/05/20 0348  WBC 6.1 3.4* 8.2 10.9*  NEUTROABS 2.0 1.9 5.9 9.2*  HGB 14.5 15.2* 14.2 14.1  HCT 42.6 44.9 42.5 42.5  MCV 92.0 93.2 93.6 93.2  PLT 154 143* 170 195   Basic Metabolic Panel: Recent Labs  Lab 11/02/20 1733 11/03/20 0857 11/04/20 0411 11/05/20 0348  NA 130* 133* 138 137  K 2.8* 3.7 3.4* 3.1*  CL 92* 97* 101 100  CO2 26 25 27 23   GLUCOSE 175* 260* 270* 302*  BUN 13 16 28* 30*  CREATININE 0.86 0.82 0.94 1.14*  CALCIUM 8.1* 8.3* 8.4* 8.4*  MG 1.6* 2.2 2.4 2.3  PHOS  --   --  2.6 3.9   GFR: Estimated Creatinine  Clearance: 34.4 mL/min (A) (by C-G formula based on SCr of 1.14 mg/dL (H)). Liver Function Tests: Recent Labs  Lab 11/02/20 1733 11/03/20 0857 11/04/20 0411 11/05/20 0348  AST 85* 68* 47* 40  ALT 61* 57* 44 37  ALKPHOS 56 57 49 55  BILITOT 0.6 0.6 0.3 0.6  PROT 7.5 7.6 7.1 7.0  ALBUMIN 3.5 3.4* 3.2* 3.2*   No results for input(s): LIPASE, AMYLASE in the last 168 hours. No results for input(s): AMMONIA in the last 168 hours. Coagulation Profile: No results for input(s): INR, PROTIME in the last 168 hours. Cardiac Enzymes: No results for input(s): CKTOTAL, CKMB, CKMBINDEX, TROPONINI in the last 168 hours. BNP (last 3 results) No results for input(s): PROBNP in the last 8760 hours. HbA1C: Recent Labs    11/03/20 0857  HGBA1C 8.6*  8.4*   CBG: Recent Labs  Lab 11/04/20 1157 11/04/20 1613 11/04/20 2100 11/05/20 0723 11/05/20 1110  GLUCAP 249* 177* 184* 248* 213*   Lipid Profile: Recent Labs    11/02/20 1733  TRIG 208*   Thyroid Function Tests: No results for input(s): TSH, T4TOTAL, FREET4, T3FREE, THYROIDAB in the last 72 hours. Anemia Panel: Recent Labs    11/04/20 0411 11/05/20 0348  FERRITIN 1,130* 1,861*   Sepsis Labs: Recent Labs  Lab 11/02/20 1733 11/03/20 0857  PROCALCITON <0.10  --   LATICACIDVEN 1.4 1.4    Recent Results (from the past 240 hour(s))  Resp Panel by RT-PCR (Flu A&B, Covid) Peripheral     Status: Abnormal   Collection Time: 11/02/20  5:33 PM   Specimen: Peripheral; Nasopharyngeal(NP) swabs in vial transport medium  Result Value Ref Range Status   SARS Coronavirus 2 by RT PCR POSITIVE (A) NEGATIVE Final    Comment: RESULT CALLED TO, READ BACK BY AND VERIFIED WITH: 12/31/20, RN AT 1849 ON [place] BY BOWLBY, J (NOTE) SARS-CoV-2 target nucleic acids are DETECTED.  The SARS-CoV-2 RNA is generally detectable in upper respiratory specimens during the acute phase of infection. Positive results are indicative of the presence of  the identified virus, but do not rule out bacterial infection or co-infection with other pathogens not detected by the test. Clinical correlation with patient history and other diagnostic information is necessary to determine patient infection status. The expected result is Negative.  Fact Sheet for Patients: 19379024  Fact Sheet for Healthcare Providers: BloggerCourse.com  This test is not yet approved or cleared by the SeriousBroker.it and  has been authorized for detection and/or diagnosis of SARS-CoV-2 by FDA under an Emergency Use Authorization (EUA).  This EUA will remain in effect (meaning thi s test can be used) for the duration of  the COVID-19 declaration under Section 564(b)(1) of the Act, 21 U.S.C. section 360bbb-3(b)(1), unless the authorization is terminated or revoked sooner.     Influenza A by PCR NEGATIVE NEGATIVE Final   Influenza B by PCR NEGATIVE NEGATIVE Final    Comment: (NOTE) The Xpert Xpress SARS-CoV-2/FLU/RSV plus assay is intended as an aid in the diagnosis of influenza from Nasopharyngeal swab specimens and should not be used as a sole basis for treatment. Nasal washings and aspirates are unacceptable for Xpert Xpress SARS-CoV-2/FLU/RSV testing.  Fact Sheet for Patients: BloggerCourse.com  Fact Sheet for Healthcare Providers: SeriousBroker.it  This test is not yet approved or cleared by the [place] FDA and has been authorized for detection and/or diagnosis of SARS-CoV-2 by FDA under an Emergency Use Authorization (EUA). This EUA will remain in effect (meaning this test can be used) for the duration of the COVID-19 declaration under Section 564(b)(1) of the Act, 21 U.S.C. section 360bbb-3(b)(1), unless the authorization is terminated or revoked.  Performed at [facility], [address] [place], [place] [address]    Blood Culture (routine x 2)     Status: None (Preliminary result)   Collection Time: 11/02/20  5:34 PM   Specimen: BLOOD  Result Value Ref Range Status   Specimen Description   Final    BLOOD RIGHT ANTECUBITAL Performed at [facility], [address] [place], [place] [address]    Special Requests   Final    BOTTLES DRAWN AEROBIC AND ANAEROBIC Blood Culture adequate volume Performed at [facility], [address] [place], [place] [address]    Culture   Final    NO GROWTH 3 DAYS Performed at [facility], [address] [place], [place] [address]    Report Status PENDING  Incomplete  Blood Culture (routine x 2)     Status: None (Preliminary result)   Collection Time: 11/02/20  5:35 PM   Specimen: BLOOD LEFT FOREARM  Result Value Ref Range Status   Specimen Description   Final    BLOOD LEFT FOREARM Performed at [facility], [address] [place], [place] [address]    Special Requests   Final    BOTTLES DRAWN AEROBIC AND ANAEROBIC Blood Culture adequate volume Performed at [facility], [address] [place], [place] [address]    Culture   Final    NO GROWTH 3 DAYS Performed at [facility], [address], [place], [place] [address]    Report Status PENDING  Incomplete         Radiology Studies: US Abdomen Limited RUQ (LIVER/GB)  Result Date: 11/03/2020 CLINICAL DATA:  Elevated liver function tests. EXAM: ULTRASOUND ABDOMEN LIMITED RIGHT UPPER QUADRANT COMPARISON:  None. FINDINGS: Gallbladder: No gallstones or wall thickening visualized. 4 mm nonshadowing echogenic focus projecting from the gallbladder wall into the lumen suggestive of a polyp. No sonographic Murphy sign noted by sonographer. Common bile duct: Diameter: 8 mm, upper limits of normal/borderline dilated for age Liver: Mildly increased parenchymal echogenicity diffusely without a focal lesion identified. Portal vein is patent on color Doppler imaging  with normal direction of blood flow towards the liver. Other: None. IMPRESSION: 1. Mildly echogenic liver, nonspecific though may reflect  steatosis. 2. No gallstones, evidence of cholecystitis, or significant biliary dilatation. 3. 4 mm gallbladder polyp. Electronically Signed   By: Sebastian AcheAllen  Grady M.D.   On: 11/03/2020 15:55        Scheduled Meds: . vitamin C  500 mg Oral Daily  . atenolol  50 mg Oral Daily   And  . chlorthalidone  25 mg Oral Daily  . enoxaparin (LOVENOX) injection  40 mg Subcutaneous Daily  . insulin aspart  0-15 Units Subcutaneous TID WC  . insulin aspart  0-5 Units Subcutaneous QHS  . insulin glargine  34 Units Subcutaneous QHS  . Ipratropium-Albuterol  1 puff Inhalation BID  . methylPREDNISolone (SOLU-MEDROL) injection  80 mg Intravenous Q24H  . multivitamin with minerals  1 tablet Oral Daily  . pantoprazole  40 mg Oral Daily  . zinc sulfate  220 mg Oral Daily   Continuous Infusions: . remdesivir 100 mg in NS 100 mL       LOS: 2 days    Time spent: 35 minutes   Barnetta ChapelSylvester I Aran Menning, MD [facility]   If 7PM-7AM, please contact night-coverage [email or web address] Password [email or web address] 11/05/2020, 1:14 PM

## 2020-11-06 ENCOUNTER — Inpatient Hospital Stay (HOSPITAL_COMMUNITY): Payer: Medicare Other

## 2020-11-06 DIAGNOSIS — J9601 Acute respiratory failure with hypoxia: Secondary | ICD-10-CM | POA: Diagnosis not present

## 2020-11-06 LAB — FERRITIN: Ferritin: 1823 ng/mL — ABNORMAL HIGH (ref 11–307)

## 2020-11-06 LAB — BLOOD GAS, ARTERIAL
Acid-Base Excess: 1.8 mmol/L (ref 0.0–2.0)
Bicarbonate: 25 mmol/L (ref 20.0–28.0)
FIO2: 28
O2 Saturation: 90.6 %
Patient temperature: 98.6
pCO2 arterial: 36.3 mmHg (ref 32.0–48.0)
pH, Arterial: 7.452 — ABNORMAL HIGH (ref 7.350–7.450)
pO2, Arterial: 64.5 mmHg — ABNORMAL LOW (ref 83.0–108.0)

## 2020-11-06 LAB — COMPREHENSIVE METABOLIC PANEL
ALT: 34 U/L (ref 0–44)
AST: 35 U/L (ref 15–41)
Albumin: 3.3 g/dL — ABNORMAL LOW (ref 3.5–5.0)
Alkaline Phosphatase: 58 U/L (ref 38–126)
Anion gap: 14 (ref 5–15)
BUN: 26 mg/dL — ABNORMAL HIGH (ref 8–23)
CO2: 26 mmol/L (ref 22–32)
Calcium: 8.6 mg/dL — ABNORMAL LOW (ref 8.9–10.3)
Chloride: 96 mmol/L — ABNORMAL LOW (ref 98–111)
Creatinine, Ser: 0.9 mg/dL (ref 0.44–1.00)
GFR, Estimated: >60 mL/min (ref >60)
Glucose, Bld: 190 mg/dL — ABNORMAL HIGH (ref 70–99)
Potassium: 3.1 mmol/L — ABNORMAL LOW (ref 3.5–5.1)
Sodium: 136 mmol/L (ref 135–145)
Total Bilirubin: 0.6 mg/dL (ref 0.3–1.2)
Total Protein: 7.1 g/dL (ref 6.5–8.1)

## 2020-11-06 LAB — CBC WITH DIFFERENTIAL/PLATELET
Abs Immature Granulocytes: 0.03 10*3/uL (ref 0.00–0.07)
Basophils Absolute: 0 10*3/uL (ref 0.0–0.1)
Basophils Relative: 0 %
Eosinophils Absolute: 0 10*3/uL (ref 0.0–0.5)
Eosinophils Relative: 0 %
HCT: 45 % (ref 36.0–46.0)
Hemoglobin: 15.2 g/dL — ABNORMAL HIGH (ref 12.0–15.0)
Immature Granulocytes: 0 %
Lymphocytes Relative: 11 %
Lymphs Abs: 1.3 10*3/uL (ref 0.7–4.0)
MCH: 31.1 pg (ref 26.0–34.0)
MCHC: 33.8 g/dL (ref 30.0–36.0)
MCV: 92.2 fL (ref 80.0–100.0)
Monocytes Absolute: 0.3 10*3/uL (ref 0.1–1.0)
Monocytes Relative: 3 %
Neutro Abs: 9.6 10*3/uL — ABNORMAL HIGH (ref 1.7–7.7)
Neutrophils Relative %: 86 %
Platelets: 203 10*3/uL (ref 150–400)
RBC: 4.88 MIL/uL (ref 3.87–5.11)
RDW: 12.4 % (ref 11.5–15.5)
WBC: 11.2 10*3/uL — ABNORMAL HIGH (ref 4.0–10.5)
nRBC: 0 % (ref 0.0–0.2)

## 2020-11-06 LAB — GLUCOSE, CAPILLARY
Glucose-Capillary: 226 mg/dL — ABNORMAL HIGH (ref 70–99)
Glucose-Capillary: 231 mg/dL — ABNORMAL HIGH (ref 70–99)
Glucose-Capillary: 194 mg/dL — ABNORMAL HIGH (ref 70–99)
Glucose-Capillary: 108 mg/dL — ABNORMAL HIGH (ref 70–99)

## 2020-11-06 LAB — PHOSPHORUS: Phosphorus: 4.1 mg/dL (ref 2.5–4.6)

## 2020-11-06 LAB — D-DIMER, QUANTITATIVE: D-Dimer, Quant: 0.33 ug/mL-FEU (ref 0.00–0.50)

## 2020-11-06 LAB — CULTURE, BLOOD (ROUTINE X 2): Culture: NO GROWTH

## 2020-11-06 LAB — MAGNESIUM
Magnesium: 2 mg/dL (ref 1.7–2.4)
Magnesium: 2 mg/dL (ref 1.7–2.4)

## 2020-11-06 LAB — C-REACTIVE PROTEIN: CRP: 0.8 mg/dL (ref <1.0)

## 2020-11-06 MED ORDER — POTASSIUM CHLORIDE CRYS ER 20 MEQ PO TBCR
40.0000 meq | EXTENDED_RELEASE_TABLET | ORAL | Status: AC
  Administered 2020-11-06 (×2): 40 meq via ORAL
  Filled 2020-11-06 (×2): qty 2

## 2020-11-06 MED ORDER — POTASSIUM CHLORIDE IN NACL 20-0.9 MEQ/L-% IV SOLN
INTRAVENOUS | Status: DC
  Filled 2020-11-06 (×2): qty 1000

## 2020-11-06 MED ORDER — LINAGLIPTIN 5 MG PO TABS
5.0000 mg | ORAL_TABLET | Freq: Every day | ORAL | Status: DC
  Administered 2020-11-06 – 2020-11-07 (×2): 5 mg via ORAL
  Filled 2020-11-06 (×2): qty 1

## 2020-11-06 NOTE — Progress Notes (Signed)
PROGRESS NOTE    Wanda Johnston  YWV:[id number] DOB: 10-20-1941 DOA: 11/02/2020 PCP: Mila Palmer, MD  Brief Narrative:  Wanda Johnston is a 80 y.o. female with medical history significant of HTN, DM2, HLD. Presenting with dyspnea for 1 week. She reports concomitant cough, N/V, and fever. She tried advil, but it did not provide any help. She notes exposure to COVID positive family members. Her symptoms were progressing to the point that she was struggling to catch her breath at rest or ambulating. Her family checked her O2 sat at home and found it to range between mid-70's and high-80's. She was taken for a outpt COVID test and it was found to be positive. She then came to the ED. She denies any other alleviating or aggravating factors.  Patient found to be COVID positive.  Patient speaks Filipino and some English  11/06/2019: Patient seen.  Patient is on IV remdesivir, and will complete current regimen tomorrow.  No worsening of shortness of breath.  11/07/2019: Seen alongside patient's granddaughter.  Patient reports feeling weak with some muscle pain.  Potassium is 3.1.  Potassium be repleted.  Patient also looks volume depleted.  We will start patient on IV fluids with potassium chloride.  Magnesium is 2.  Patient is improving from respiratory point.  Assessment & Plan:   Active Problems:   Acute respiratory failure with hypoxia (HCC)   COVID-19 pneumonia:   -Continue steroids, remdesivir, Combivent inhaler, anti-tussives. -Continue to check inflammatory markers.  Ferritin is 1823 today. -Continue other supportive measures. -Continue supplemental oxygen as needed.  Diabetes mellitus type 2: -Continue subcutaneous Lantus and sliding scale insulin coverage. -Continue to optimize.   Hypertension: -Controlled. -Continue to monitor electrolytes and renal function.  Have a low threshold to discontinue chlorthalidone.  GERD     - continue protonix  Hyperlipidemia:  -Statins on hold  due to elevated liver function test.    Electrolyte abnormalities: Hypokalemia.  Potassium is 3.1. Hypomagnesemia Hyponatremia -Continue to monitor and replete. -Check magnesium level. -K-Dur 40 M EQ p.o. every 4 hours x 2 doses. -Renal panel and magnesium level in the morning.  Volume depletion: -Start IV fluid (normal saline plus KCl) at 75 cc/h.   Elevated LFTs -AST and ALT are back to normal range. -Acute hepatitis panel is nonreactive. -Abdominal ultrasound revealed  mildly echogenic liver and small gallbladder polyp   DVT prophylaxis: Lovenox Code Status: Full Family Communication: None Disposition Plan:  Patient from home                                 Anticipate dc to home in 3-4 days                                 Patient not medically ready for discharge                              .   Consultants:   None    Subjective: No worsening of shortness of breath. Reports feeling weak with some muscle pain..  Objective: Vitals:   11/05/20 0452 11/05/20 1440 11/05/20 2022 11/06/20 0430  BP: 121/69 135/74 139/74 118/62  Pulse: (!) 57 (!) 55 (!) 55 60  Resp: 20 (!) 22 (!) 22 18  Temp: 97.7 F (36.5 C) 98.7 F (37.1 C) 97.8  F (36.6 C) 98 F (36.7 C)  TempSrc:   Oral Oral  SpO2: 95% 92% 94% 94%  Weight:      Height:        Intake/Output Summary (Last 24 hours) at 11/06/2020 1310 Last data filed at 11/05/2020 1659 Gross per 24 hour  Intake 200 ml  Output -  Net 200 ml   Filed Weights   11/02/20 1703  Weight: 63.5 kg    Examination:  General exam: Patient is not in any distress.  Appears calm and comfortable.  Dry buccal mucosa. Respiratory system: Decreased air entry. Cardiovascular system: S1 & S2   Gastrointestinal system: Abdomen is nondistended, soft and nontender.  Central nervous system: Awake and alert.  Patient moves all extremities.   Extremities: No leg edema.  Data Reviewed: I have personally reviewed following labs and imaging  studies reports  CBC: Recent Labs  Lab 11/02/20 1733 11/03/20 0857 11/04/20 0411 11/05/20 0348 11/06/20 0356  WBC 6.1 3.4* 8.2 10.9* 11.2*  NEUTROABS 2.0 1.9 5.9 9.2* 9.6*  HGB 14.5 15.2* 14.2 14.1 15.2*  HCT 42.6 44.9 42.5 42.5 45.0  MCV 92.0 93.2 93.6 93.2 92.2  PLT 154 143* 170 195 203   Basic Metabolic Panel: Recent Labs  Lab 11/02/20 1733 11/03/20 0857 11/04/20 0411 11/05/20 0348 11/06/20 0356  NA 130* 133* 138 137 136  K 2.8* 3.7 3.4* 3.1* 3.1*  CL 92* 97* 101 100 96*  CO2 26 25 27 23 26   GLUCOSE 175* 260* 270* 302* 190*  BUN 13 16 28* 30* 26*  CREATININE 0.86 0.82 0.94 1.14* 0.90  CALCIUM 8.1* 8.3* 8.4* 8.4* 8.6*  MG 1.6* 2.2 2.4 2.3 2.0  PHOS  --   --  2.6 3.9 4.1   GFR: Estimated Creatinine Clearance: 43.6 mL/min (by C-G formula based on SCr of 0.9 mg/dL). Liver Function Tests: Recent Labs  Lab 11/02/20 1733 11/03/20 0857 11/04/20 0411 11/05/20 0348 11/06/20 0356  AST 85* 68* 47* 40 35  ALT 61* 57* 44 37 34  ALKPHOS 56 57 49 55 58  BILITOT 0.6 0.6 0.3 0.6 0.6  PROT 7.5 7.6 7.1 7.0 7.1  ALBUMIN 3.5 3.4* 3.2* 3.2* 3.3*   No results for input(s): LIPASE, AMYLASE in the last 168 hours. No results for input(s): AMMONIA in the last 168 hours. Coagulation Profile: No results for input(s): INR, PROTIME in the last 168 hours. Cardiac Enzymes: No results for input(s): CKTOTAL, CKMB, CKMBINDEX, TROPONINI in the last 168 hours. BNP (last 3 results) No results for input(s): PROBNP in the last 8760 hours. HbA1C: No results for input(s): HGBA1C in the last 72 hours. CBG: Recent Labs  Lab 11/05/20 1110 11/05/20 1629 11/05/20 2023 11/06/20 0725 11/06/20 1123  GLUCAP 213* 159* 141* 226* 231*   Lipid Profile: No results for input(s): CHOL, HDL, LDLCALC, TRIG, CHOLHDL, LDLDIRECT in the last 72 hours. Thyroid Function Tests: No results for input(s): TSH, T4TOTAL, FREET4, T3FREE, THYROIDAB in the last 72 hours. Anemia Panel: Recent Labs     11/05/20 0348 11/06/20 0356  FERRITIN 1,861* 1,823*   Sepsis Labs: Recent Labs  Lab 11/02/20 1733 11/03/20 0857  PROCALCITON <0.10  --   LATICACIDVEN 1.4 1.4    Recent Results (from the past 240 hour(s))  Resp Panel by RT-PCR (Flu A&B, Covid) Peripheral     Status: Abnormal   Collection Time: 11/02/20  5:33 PM   Specimen: Peripheral; Nasopharyngeal(NP) swabs in vial transport medium  Result Value Ref Range Status   SARS  Coronavirus 2 by RT PCR POSITIVE (A) NEGATIVE Final    Comment: RESULT CALLED TO, READ BACK BY AND VERIFIED WITH: Solmon Ice, RN AT 1849 ON [id number] BY BOWLBY, J (NOTE) SARS-CoV-2 target nucleic acids are DETECTED.  The SARS-CoV-2 RNA is generally detectable in upper respiratory specimens during the acute phase of infection. Positive results are indicative of the presence of the identified virus, but do not rule out bacterial infection or co-infection with other pathogens not detected by the test. Clinical correlation with patient history and other diagnostic information is necessary to determine patient infection status. The expected result is Negative.  Fact Sheet for Patients: BloggerCourse.com  Fact Sheet for Healthcare Providers: SeriousBroker.it  This test is not yet approved or cleared by the [place] FDA and  has been authorized for detection and/or diagnosis of SARS-CoV-2 by FDA under an Emergency Use Authorization (EUA).  This EUA will remain in effect (meaning thi s test can be used) for the duration of  the COVID-19 declaration under Section 564(b)(1) of the Act, 21 U.S.C. section 360bbb-3(b)(1), unless the authorization is terminated or revoked sooner.     Influenza A by PCR NEGATIVE NEGATIVE Final   Influenza B by PCR NEGATIVE NEGATIVE Final    Comment: (NOTE) The Xpert Xpress SARS-CoV-2/FLU/RSV plus assay is intended as an aid in the diagnosis of influenza from Nasopharyngeal swab  specimens and should not be used as a sole basis for treatment. Nasal washings and aspirates are unacceptable for Xpert Xpress SARS-CoV-2/FLU/RSV testing.  Fact Sheet for Patients: BloggerCourse.com  Fact Sheet for Healthcare Providers: SeriousBroker.it  This test is not yet approved or cleared by the [place] FDA and has been authorized for detection and/or diagnosis of SARS-CoV-2 by FDA under an Emergency Use Authorization (EUA). This EUA will remain in effect (meaning this test can be used) for the duration of the COVID-19 declaration under Section 564(b)(1) of the Act, 21 U.S.C. section 360bbb-3(b)(1), unless the authorization is terminated or revoked.  Performed at [facility], [address] [place], [place] [address]   Blood Culture (routine x 2)     Status: None (Preliminary result)   Collection Time: 11/02/20  5:34 PM   Specimen: BLOOD  Result Value Ref Range Status   Specimen Description   Final    BLOOD RIGHT ANTECUBITAL Performed at [facility], [address], [place], [place] [address]    Special Requests   Final    BOTTLES DRAWN AEROBIC AND ANAEROBIC Blood Culture adequate volume Performed at [facility], [address] [place], [place] [address]    Culture   Final    NO GROWTH 3 DAYS Performed at [facility], [address] [place], [place] [address]    Report Status PENDING  Incomplete  Blood Culture (routine x 2)     Status: None (Preliminary result)   Collection Time: 11/02/20  5:35 PM   Specimen: BLOOD LEFT FOREARM  Result Value Ref Range Status   Specimen Description   Final    BLOOD LEFT FOREARM Performed at [facility], [address] [place], [place] [address]    Special Requests   Final    BOTTLES DRAWN AEROBIC AND ANAEROBIC Blood Culture adequate volume Performed at [facility], [address] [place], [place] [address]     Culture   Final    NO GROWTH 3 DAYS Performed at [facility], [address] [place], [place] [address]    Report Status PENDING  Incomplete         Radiology Studies: No results found.      Scheduled Meds: . vitamin C  500 mg Oral Daily  . atenolol  50 mg Oral Daily  . enoxaparin (LOVENOX) injection  40 mg Subcutaneous Daily  . insulin aspart  0-15 Units Subcutaneous TID WC  . insulin aspart  0-5 Units Subcutaneous QHS  . insulin glargine  34 Units Subcutaneous QHS  . Ipratropium-Albuterol  1 puff Inhalation BID  . linagliptin  5 mg Oral Daily  . methylPREDNISolone (SOLU-MEDROL) injection  80 mg Intravenous Q24H  . multivitamin with minerals  1 tablet Oral Daily  . pantoprazole  40 mg Oral Daily  . potassium chloride  40 mEq Oral Q4H  . zinc sulfate  220 mg Oral Daily   Continuous Infusions:    LOS: 3 days    Time spent: 35 minutes   Barnetta Chapel, MD [facility]   If 7PM-7AM, please contact night-coverage [email or web address] Password [email or web address] 11/06/2020, 1:10 PM

## 2020-11-06 NOTE — Plan of Care (Signed)
  Problem: Education: Goal: Knowledge of General Education information will improve Description: Including pain rating scale, medication(s)/side effects and non-pharmacologic comfort measures Outcome: Progressing   Problem: Health Behavior/Discharge Planning: Goal: Ability to manage health-related needs will improve Outcome: Progressing   Problem: Clinical Measurements: Goal: Ability to maintain clinical measurements within normal limits will improve Outcome: Progressing Goal: Will remain free from infection Outcome: Progressing Goal: Diagnostic test results will improve Outcome: Progressing Goal: Respiratory complications will improve Outcome: Progressing Goal: Cardiovascular complication will be avoided Outcome: Progressing   Problem: Activity: Goal: Risk for activity intolerance will decrease Outcome: Progressing   Problem: Nutrition: Goal: Adequate nutrition will be maintained Outcome: Progressing   Problem: Coping: Goal: Level of anxiety will decrease Outcome: Progressing   Problem: Elimination: Goal: Will not experience complications related to bowel motility Outcome: Progressing Goal: Will not experience complications related to urinary retention Outcome: Progressing   Problem: Pain Managment: Goal: General experience of comfort will improve Outcome: Progressing   Problem: Skin Integrity: Goal: Risk for impaired skin integrity will decrease Outcome: Progressing   Problem: Education: Goal: Knowledge of risk factors and measures for prevention of condition will improve Outcome: Progressing   Problem: Coping: Goal: Psychosocial and spiritual needs will be supported Outcome: Progressing   Problem: Respiratory: Goal: Will maintain a patent airway Outcome: Progressing Goal: Complications related to the disease process, condition or treatment will be avoided or minimized Outcome: Progressing   

## 2020-11-06 NOTE — Progress Notes (Signed)
   11/06/20 1600  Assess: MEWS Score  Temp (!) 96.1 F (35.6 C)  BP (!) 143/67  Pulse Rate 62  ECG Heart Rate 60  Resp 18  Level of Consciousness Alert  SpO2 92 %  O2 Device Nasal Cannula  Patient Activity (if Appropriate) In bed  O2 Flow Rate (L/min) 2 L/min  Assess: MEWS Score  MEWS Temp 1  MEWS Systolic 0  MEWS Pulse 0  MEWS RR 0  MEWS LOC 0  MEWS Score 1  MEWS Score Color Green  Assess: if the MEWS score is Yellow or Red  Were vital signs taken at a resting state? Yes  Focused Assessment Change from prior assessment (see assessment flowsheet)  Early Detection of Sepsis Score *See Row Information* Low  MEWS guidelines implemented *See Row Information* No, other (Comment)  Treat  MEWS Interventions Other (Comment) (Family visited)  Pain Scale 0-10  Pain Score 0  Take Vital Signs  Increase Vital Sign Frequency   (Keep original yellow mews tiems)  Escalate  MEWS: Escalate  (Keep yellow)  Notify: Charge Nurse/RN  Name of Charge Nurse/RN Notified Toniann Fail RN  Date Charge Nurse/RN Notified 11/06/20  Time Charge Nurse/RN Notified 1624  Notify: Provider  Provider Name/Title OGBATA  Date Provider Notified 11/06/20  Time Provider Notified 1624  Notification Type  (AMION)  Notification Reason Other (Comment) (Update MD)  Response See new orders  Date of Provider Response 11/06/20  Time of Provider Response 1624  Document  Patient Outcome Stabilized after interventions  Progress note created (see row info) Yes

## 2020-11-06 NOTE — Progress Notes (Signed)
   11/06/20 1347  Assess: MEWS Score  Temp 98.2 F (36.8 C)  BP 129/67  Pulse Rate (!) 50  ECG Heart Rate (!) 48  Resp (!) 26  Level of Consciousness Alert  SpO2 91 %  O2 Device Nasal Cannula  Patient Activity (if Appropriate) Ambulating  O2 Flow Rate (L/min) 2 L/min  Assess: MEWS Score  MEWS Temp 0  MEWS Systolic 0  MEWS Pulse 1  MEWS RR 2  MEWS LOC 0  MEWS Score 3  MEWS Score Color Yellow  Assess: if the MEWS score is Yellow or Red  Were vital signs taken at a resting state? Yes  Focused Assessment Change from prior assessment (see assessment flowsheet)  Early Detection of Sepsis Score *See Row Information* Low  MEWS guidelines implemented *See Row Information* Yes  Treat  MEWS Interventions Administered prn meds/treatments  Pain Scale 0-10  Pain Score 0  Take Vital Signs  Increase Vital Sign Frequency  Yellow: Q 2hr X 2 then Q 4hr X 2, if remains yellow, continue Q 4hrs  Escalate  MEWS: Escalate Yellow: discuss with charge nurse/RN and consider discussing with provider and RRT  Notify: Charge Nurse/RN  Name of Charge Nurse/RN Notified Toniann Fail RN  Date Charge Nurse/RN Notified 11/06/20  Time Charge Nurse/RN Notified 1353  Notify: Provider  Provider Name/Title Ogbata  Date Provider Notified 11/06/20  Time Provider Notified 1353  Notification Type  (Amion)  Notification Reason Change in status  Response Other (Comment) (Ordered Chest Xray and ABG)  Date of Provider Response 11/06/20  Time of Provider Response 1356  Document  Patient Outcome Stabilized after interventions  Progress note created (see row info) Yes

## 2020-11-06 NOTE — Progress Notes (Signed)
   11/06/20 1347  Assess: MEWS Score  Temp 98.2 F (36.8 C)  BP 129/67  Pulse Rate (!) 50  ECG Heart Rate (!) 48  Resp (!) 26  Level of Consciousness Alert  SpO2 91 %  O2 Device Nasal Cannula  Patient Activity (if Appropriate) Ambulating  O2 Flow Rate (L/min) 2 L/min  Assess: MEWS Score  MEWS Temp 0  MEWS Systolic 0  MEWS Pulse 1  MEWS RR 2  MEWS LOC 0  MEWS Score 3  MEWS Score Color Yellow  Assess: if the MEWS score is Yellow or Red  Were vital signs taken at a resting state? Yes  Focused Assessment Change from prior assessment (see assessment flowsheet)  Early Detection of Sepsis Score *See Row Information* Low  MEWS guidelines implemented *See Row Information* Yes  Treat  MEWS Interventions Administered prn meds/treatments  Pain Scale 0-10  Pain Score 0  Take Vital Signs  Increase Vital Sign Frequency  Yellow: Q 2hr X 2 then Q 4hr X 2, if remains yellow, continue Q 4hrs  Escalate  MEWS: Escalate Yellow: discuss with charge nurse/RN and consider discussing with provider and RRT  Notify: Charge Nurse/RN  Name of Charge Nurse/RN Notified Toniann Fail RN  Date Charge Nurse/RN Notified 11/06/20  Time Charge Nurse/RN Notified 1353  Notify: Provider  Provider Name/Title Ogbata  Date Provider Notified 11/06/20  Time Provider Notified 1353  Notification Type  (Amion)  Notification Reason Change in status  Response Other (Comment) (Waiting response)  Date of Provider Response 11/06/20  Time of Provider Response 1356  Document  Patient Outcome Stabilized after interventions  Progress note created (see row info) Yes

## 2020-11-07 ENCOUNTER — Other Ambulatory Visit: Payer: Self-pay | Admitting: Internal Medicine

## 2020-11-07 DIAGNOSIS — E871 Hypo-osmolality and hyponatremia: Secondary | ICD-10-CM

## 2020-11-07 DIAGNOSIS — E876 Hypokalemia: Secondary | ICD-10-CM

## 2020-11-07 DIAGNOSIS — J9601 Acute respiratory failure with hypoxia: Secondary | ICD-10-CM | POA: Diagnosis not present

## 2020-11-07 DIAGNOSIS — J1282 Pneumonia due to coronavirus disease 2019: Secondary | ICD-10-CM

## 2020-11-07 DIAGNOSIS — U071 COVID-19: Secondary | ICD-10-CM | POA: Diagnosis not present

## 2020-11-07 LAB — CBC WITH DIFFERENTIAL/PLATELET
Abs Immature Granulocytes: 0.04 10*3/uL (ref 0.00–0.07)
Basophils Absolute: 0 10*3/uL (ref 0.0–0.1)
Basophils Relative: 0 %
Eosinophils Absolute: 0 10*3/uL (ref 0.0–0.5)
Eosinophils Relative: 0 %
HCT: 44.5 % (ref 36.0–46.0)
Hemoglobin: 14.9 g/dL (ref 12.0–15.0)
Immature Granulocytes: 0 %
Lymphocytes Relative: 11 %
Lymphs Abs: 1 10*3/uL (ref 0.7–4.0)
MCH: 31.2 pg (ref 26.0–34.0)
MCHC: 33.5 g/dL (ref 30.0–36.0)
MCV: 93.1 fL (ref 80.0–100.0)
Monocytes Absolute: 0.2 10*3/uL (ref 0.1–1.0)
Monocytes Relative: 2 %
Neutro Abs: 7.8 10*3/uL — ABNORMAL HIGH (ref 1.7–7.7)
Neutrophils Relative %: 87 %
Platelets: 201 10*3/uL (ref 150–400)
RBC: 4.78 MIL/uL (ref 3.87–5.11)
RDW: 12.6 % (ref 11.5–15.5)
WBC: 9 10*3/uL (ref 4.0–10.5)
nRBC: 0 % (ref 0.0–0.2)

## 2020-11-07 LAB — MAGNESIUM: Magnesium: 2 mg/dL (ref 1.7–2.4)

## 2020-11-07 LAB — COMPREHENSIVE METABOLIC PANEL
ALT: 27 U/L (ref 0–44)
AST: 28 U/L (ref 15–41)
Albumin: 3.1 g/dL — ABNORMAL LOW (ref 3.5–5.0)
Alkaline Phosphatase: 55 U/L (ref 38–126)
Anion gap: 9 (ref 5–15)
BUN: 28 mg/dL — ABNORMAL HIGH (ref 8–23)
CO2: 23 mmol/L (ref 22–32)
Calcium: 8.1 mg/dL — ABNORMAL LOW (ref 8.9–10.3)
Chloride: 104 mmol/L (ref 98–111)
Creatinine, Ser: 0.95 mg/dL (ref 0.44–1.00)
GFR, Estimated: >60 mL/min (ref >60)
Glucose, Bld: 292 mg/dL — ABNORMAL HIGH (ref 70–99)
Potassium: 4.3 mmol/L (ref 3.5–5.1)
Sodium: 136 mmol/L (ref 135–145)
Total Bilirubin: 0.8 mg/dL (ref 0.3–1.2)
Total Protein: 6.8 g/dL (ref 6.5–8.1)

## 2020-11-07 LAB — RENAL FUNCTION PANEL
Albumin: 3.1 g/dL — ABNORMAL LOW (ref 3.5–5.0)
Anion gap: 9 (ref 5–15)
BUN: 29 mg/dL — ABNORMAL HIGH (ref 8–23)
CO2: 23 mmol/L (ref 22–32)
Calcium: 8.2 mg/dL — ABNORMAL LOW (ref 8.9–10.3)
Chloride: 104 mmol/L (ref 98–111)
Creatinine, Ser: 0.91 mg/dL (ref 0.44–1.00)
GFR, Estimated: >60 mL/min (ref >60)
Glucose, Bld: 291 mg/dL — ABNORMAL HIGH (ref 70–99)
Phosphorus: 3.3 mg/dL (ref 2.5–4.6)
Potassium: 4.3 mmol/L (ref 3.5–5.1)
Sodium: 136 mmol/L (ref 135–145)

## 2020-11-07 LAB — D-DIMER, QUANTITATIVE: D-Dimer, Quant: <0.27 ug/mL-FEU (ref 0.00–0.50)

## 2020-11-07 LAB — GLUCOSE, CAPILLARY
Glucose-Capillary: 303 mg/dL — ABNORMAL HIGH (ref 70–99)
Glucose-Capillary: 305 mg/dL — ABNORMAL HIGH (ref 70–99)

## 2020-11-07 LAB — C-REACTIVE PROTEIN: CRP: 0.6 mg/dL (ref <1.0)

## 2020-11-07 LAB — CULTURE, BLOOD (ROUTINE X 2)
Special Requests: ADEQUATE
Special Requests: ADEQUATE

## 2020-11-07 LAB — FERRITIN: Ferritin: 1390 ng/mL — ABNORMAL HIGH (ref 11–307)

## 2020-11-07 LAB — PHOSPHORUS: Phosphorus: 3.3 mg/dL (ref 2.5–4.6)

## 2020-11-07 MED ORDER — ALBUTEROL SULFATE HFA 108 (90 BASE) MCG/ACT IN AERS: 2.0000 | INHALATION_SPRAY | RESPIRATORY_TRACT | 1 refills | Status: AC | PRN

## 2020-11-07 MED ORDER — ASCORBIC ACID 500 MG PO TABS: 500.0000 mg | ORAL_TABLET | Freq: Every day | ORAL | 0 refills | Status: AC

## 2020-11-07 MED ORDER — LINAGLIPTIN 5 MG PO TABS: 5.0000 mg | ORAL_TABLET | Freq: Every day | ORAL | 0 refills | Status: AC

## 2020-11-07 MED ORDER — ZINC SULFATE 220 (50 ZN) MG PO CAPS: 220.0000 mg | ORAL_CAPSULE | Freq: Every day | ORAL | 0 refills | Status: AC

## 2020-11-07 MED ORDER — ADULT MULTIVITAMIN W/MINERALS CH: 1.0000 | ORAL_TABLET | Freq: Every day | ORAL | 0 refills | Status: AC

## 2020-11-07 MED ORDER — ATENOLOL 50 MG PO TABS: 50.0000 mg | ORAL_TABLET | Freq: Every day | ORAL | 0 refills | Status: AC

## 2020-11-07 MED ORDER — IPRATROPIUM-ALBUTEROL 20-100 MCG/ACT IN AERS: 1.0000 | INHALATION_SPRAY | Freq: Two times a day (BID) | RESPIRATORY_TRACT | 0 refills | Status: AC

## 2020-11-07 MED ORDER — INSULIN GLARGINE 100 UNIT/ML ~~LOC~~ SOLN: 34.0000 [IU] | Freq: Every day | SUBCUTANEOUS | 11 refills | Status: AC

## 2020-11-07 MED ORDER — DEXAMETHASONE 6 MG PO TABS: 6.0000 mg | ORAL_TABLET | Freq: Every day | ORAL | 0 refills | Status: AC

## 2020-11-07 NOTE — Discharge Summary (Signed)
Physician Discharge Summary  Patient ID: Wanda Johnston MRN: [id number] DOB/AGE: Oct 02, 1941 80 y.o.  Admit date: 11/02/2020 Discharge date: 11/07/2020  Admission Diagnoses:  Discharge Diagnoses:  Active Problems:   Acute respiratory failure with hypoxia (HCC)   Pneumonia secondary to COVID-19.   Hypokalemia.   Volume depletion   Elevated liver function test.  Discharged Condition: stable  Hospital Course:  Patient is a a 80 year old female with past medical history significant for hypertension, type 2 diabetes mellitus and hyperlipidemia.  Presented with 1 week history of dyspnea, with associated fever, cough, nausea and vomiting.  Patient also had positive family history of COVID illness.  On presentation to the hospital, patient was struggling to breathe at rest, with O2 sat in the mid 70s.  Patient tested positive for COVID-19.  Patient was admitted and managed with IV remdesivir, steroids and other supportive measures.  During the hospital stay, patient was noted to be significantly hypokalemic.  Chlorthalidone has been discontinued.  Potassium has been repleted.  Potassium level prior to discharge was 4.3.  Blood pressure remained under good control.  Patient will be discharged back home on supplemental oxygen.  Patient will also complete course of steroid treatment.  Patient has been adequately hydrated.  Patient will follow up with your primary care provider within 1 week of discharge.    COVID-19 pneumonia:  -Patient was managed with remdesivir, steroids and other supportive measures. -Inflammatory markers were monitored during the hospital stay. -Patient has continued to improve. -Patient will be discharged back home on home oxygen. -Patient will complete steroid treatment on discharge.    Diabetes mellitus type 2: -Continue to monitor blood sugar closely on discharge.    Hypertension: -Controlled.  GERD - continue protonix  Hyperlipidemia:  -Statins were held due  to elevated liver function test.    Electrolyte abnormalities: Potassium was noted to be 3.1.   Potassium has been repleted.   Magnesium level is back to normal.   Hyponatremia has also resolved.  Volume depletion: -Patient has been adequately hydrated.     Elevated LFTs -AST and ALT are back to normal range. -Acute hepatitis panel came back negative.  -Abdominal ultrasound revealedmildly echogenic liver and small gallbladder polyp  Consults: None  Significant Diagnostic Studies: labs: COVID-19 test came back positive.  Treatments: Patient was managed with IV remdesivir, steroids and supportive treatment.  Discharge Exam: Blood pressure 130/73, pulse 72, temperature 98.2 F (36.8 C), temperature source Oral, resp. rate (!) 21, height 5' (1.524 m), weight 63.5 kg, SpO2 92 %.   Disposition: Discharge disposition: 01-Home or Self Care   Discharge Instructions    Diet - low sodium heart healthy   Complete by: As directed    Diet Carb Modified   Complete by: As directed    Increase activity slowly   Complete by: As directed      Allergies as of 11/07/2020   No Known Allergies     Medication List    STOP taking these medications   atenolol-chlorthalidone 50-25 MG tablet Commonly known as: TENORETIC   ibuprofen 200 MG tablet Commonly known as: ADVIL   Lantus SoloStar 100 UNIT/ML Solostar Pen Generic drug: insulin glargine Replaced by: insulin glargine 100 UNIT/ML injection   metFORMIN 500 MG 24 hr tablet Commonly known as: GLUCOPHAGE-XR     TAKE these medications   albuterol 108 (90 Base) MCG/ACT inhaler Commonly known as: VENTOLIN HFA Inhale 2 puffs into the lungs every 4 (four) hours as needed for wheezing or shortness  of breath.   ascorbic acid 500 MG tablet Commonly known as: VITAMIN C Take 1 tablet (500 mg total) by mouth daily. Start taking on: November 08, 2020   atenolol 50 MG tablet Commonly known as: TENORMIN Take 1 tablet (50 mg total) by  mouth daily. Start taking on: November 08, 2020   atorvastatin 40 MG tablet Commonly known as: LIPITOR Take 40 mg by mouth at bedtime.   dexamethasone 6 MG tablet Commonly known as: Decadron Take 1 tablet (6 mg total) by mouth daily for 5 days.   insulin glargine 100 UNIT/ML injection Commonly known as: LANTUS Inject 0.34 mLs (34 Units total) into the skin at bedtime. Replaces: Lantus SoloStar 100 UNIT/ML Solostar Pen   Ipratropium-Albuterol 20-100 MCG/ACT Aers respimat Commonly known as: COMBIVENT Inhale 1 puff into the lungs 2 (two) times daily.   linagliptin 5 MG Tabs tablet Commonly known as: TRADJENTA Take 1 tablet (5 mg total) by mouth daily. Start taking on: November 08, 2020   multivitamin with minerals Tabs tablet Take 1 tablet by mouth daily. Start taking on: November 08, 2020   pantoprazole 40 MG tablet Commonly known as: PROTONIX Take 40 mg by mouth daily.   zinc sulfate 220 (50 Zn) MG capsule Take 1 capsule (220 mg total) by mouth daily. Start taking on: November 08, 2020            Durable Medical Equipment  (From admission, onward)         Start     Ordered   11/07/20 1350  DME Oxygen  Once       Question Answer Comment  Length of Need 6 Months   Mode or (Route) Nasal cannula   Liters per Minute 3   Oxygen delivery system Gas      11/07/20 1354           Signed: Barnetta Chapel 11/07/2020, 1:54 PM

## 2020-11-07 NOTE — Progress Notes (Signed)
Pt. Discharged via wheelchair accompanied by NT and family. Pt. Is alert and oriented, vital signs stable. AVS/Discharge instruction given to daughter Graylin Shiver), daughter is a Engineer, civil (consulting), verbalized understanding. All patient belongings are with the family.

## 2020-11-07 NOTE — Progress Notes (Signed)
SATURATION QUALIFICATIONS:   Patient Saturations on Room Air at Rest = 89-90%  Patient Saturations on Room Air while Ambulating = 83-85%  Patient Saturations on 2 Liters of oxygen while Ambulating =88-89%  Patient unable to tolerate ambulation without oxygen. Patient is positive for dyspnea on exertion.

## 2020-11-07 NOTE — Care Management Important Message (Signed)
Important Message  Patient Details IM Letter given to the Patient. Name: Wanda Johnston MRN: [id number] Date of Birth: 1941/01/02   Medicare Important Message Given:  Yes     Caren Macadam 11/07/2020, 12:05 PM

## 2020-11-07 NOTE — TOC Transition Note (Signed)
Transition of Care [facility]) - CM/SW Discharge Note   Patient Details  Name: midge momon MRN: [id number] Date of Birth: 1941-07-04  Transition of Care [facility] CM/SW Contact:  Ida Rogue, LCSW Phone Number: 11/07/2020, 3:05 PM   Clinical Narrative:   Patient who is stable for discharge today is in need of home O2, has a ride home.  SAT note and orders seen and appreciated. Contacted Morrie Sheldon with [employer] who will arrange for delivery of home unit and travel cannister.  No further needs identified.  TOC sign off.    Final next level of care: Home/Self Care Barriers to Discharge: No Barriers Identified   Patient Goals and CMS Choice        Discharge Placement                       Discharge Plan and Services                                     Social Determinants of Health (SDOH) Interventions     Readmission Risk Interventions No flowsheet data found.

## 2020-11-07 NOTE — Plan of Care (Signed)
Alert and oriented. O2 at 2L [place]. Patient desaturates to low 80s on room air and exertion.  Problem: Education: Goal: Knowledge of General Education information will improve Description: Including pain rating scale, medication(s)/side effects and non-pharmacologic comfort measures 11/07/2020 0331 by Robyne Peers, RN Outcome: Progressing 11/07/2020 0331 by Robyne Peers, RN Outcome: Progressing   Problem: Health Behavior/Discharge Planning: Goal: Ability to manage health-related needs will improve 11/07/2020 0331 by Robyne Peers, RN Outcome: Progressing 11/07/2020 0331 by Robyne Peers, RN Outcome: Progressing   Problem: Clinical Measurements: Goal: Ability to maintain clinical measurements within normal limits will improve 11/07/2020 0331 by Robyne Peers, RN Outcome: Progressing 11/07/2020 0331 by Robyne Peers, RN Outcome: Progressing Goal: Will remain free from infection 11/07/2020 0331 by Robyne Peers, RN Outcome: Progressing 11/07/2020 0331 by Robyne Peers, RN Outcome: Progressing Goal: Diagnostic test results will improve 11/07/2020 0331 by Robyne Peers, RN Outcome: Progressing 11/07/2020 0331 by Robyne Peers, RN Outcome: Progressing Goal: Respiratory complications will improve 11/07/2020 0331 by Robyne Peers, RN Outcome: Progressing 11/07/2020 0331 by Robyne Peers, RN Outcome: Progressing Goal: Cardiovascular complication will be avoided 11/07/2020 0331 by Robyne Peers, RN Outcome: Progressing 11/07/2020 0331 by Robyne Peers, RN Outcome: Progressing   Problem: Activity: Goal: Risk for activity intolerance will decrease 11/07/2020 0331 by Robyne Peers, RN Outcome: Progressing 11/07/2020 0331 by Robyne Peers, RN Outcome: Progressing   Problem: Nutrition: Goal: Adequate nutrition will be maintained 11/07/2020 0331 by Robyne Peers, RN Outcome: Progressing 11/07/2020 0331 by Robyne Peers, RN Outcome: Progressing   Problem:  Coping: Goal: Level of anxiety will decrease 11/07/2020 0331 by Robyne Peers, RN Outcome: Progressing 11/07/2020 0331 by Robyne Peers, RN Outcome: Progressing   Problem: Elimination: Goal: Will not experience complications related to bowel motility 11/07/2020 0331 by Robyne Peers, RN Outcome: Progressing 11/07/2020 0331 by Robyne Peers, RN Outcome: Progressing Goal: Will not experience complications related to urinary retention 11/07/2020 0331 by Robyne Peers, RN Outcome: Progressing 11/07/2020 0331 by Robyne Peers, RN Outcome: Progressing   Problem: Pain Managment: Goal: General experience of comfort will improve 11/07/2020 0331 by Robyne Peers, RN Outcome: Progressing 11/07/2020 0331 by Robyne Peers, RN Outcome: Progressing   Problem: Skin Integrity: Goal: Risk for impaired skin integrity will decrease 11/07/2020 0331 by Robyne Peers, RN Outcome: Progressing 11/07/2020 0331 by Robyne Peers, RN Outcome: Progressing   Problem: Education: Goal: Knowledge of risk factors and measures for prevention of condition will improve 11/07/2020 0331 by Robyne Peers, RN Outcome: Progressing 11/07/2020 0331 by Robyne Peers, RN Outcome: Progressing   Problem: Coping: Goal: Psychosocial and spiritual needs will be supported 11/07/2020 0331 by Robyne Peers, RN Outcome: Progressing 11/07/2020 0331 by Robyne Peers, RN Outcome: Progressing   Problem: Respiratory: Goal: Will maintain a patent airway 11/07/2020 0331 by Robyne Peers, RN Outcome: Progressing 11/07/2020 0331 by Robyne Peers, RN Outcome: Progressing Goal: Complications related to the disease process, condition or treatment will be avoided or minimized 11/07/2020 0331 by Robyne Peers, RN Outcome: Progressing 11/07/2020 0331 by Robyne Peers, RN Outcome: Progressing

## 2020-11-07 NOTE — Plan of Care (Signed)
  Problem: Education: Goal: Knowledge of General Education information will improve Description: Including pain rating scale, medication(s)/side effects and non-pharmacologic comfort measures Outcome: Adequate for Discharge   Problem: Health Behavior/Discharge Planning: Goal: Ability to manage health-related needs will improve Outcome: Adequate for Discharge   Problem: Clinical Measurements: Goal: Ability to maintain clinical measurements within normal limits will improve Outcome: Adequate for Discharge Goal: Will remain free from infection Outcome: Adequate for Discharge Goal: Diagnostic test results will improve Outcome: Adequate for Discharge Goal: Respiratory complications will improve Outcome: Adequate for Discharge Goal: Cardiovascular complication will be avoided Outcome: Adequate for Discharge   Problem: Activity: Goal: Risk for activity intolerance will decrease Outcome: Adequate for Discharge   Problem: Nutrition: Goal: Adequate nutrition will be maintained Outcome: Adequate for Discharge   Problem: Coping: Goal: Level of anxiety will decrease Outcome: Adequate for Discharge   Problem: Elimination: Goal: Will not experience complications related to bowel motility Outcome: Adequate for Discharge Goal: Will not experience complications related to urinary retention Outcome: Adequate for Discharge   Problem: Pain Managment: Goal: General experience of comfort will improve Outcome: Adequate for Discharge   Problem: Skin Integrity: Goal: Risk for impaired skin integrity will decrease Outcome: Adequate for Discharge   Problem: Education: Goal: Knowledge of risk factors and measures for prevention of condition will improve Outcome: Adequate for Discharge   Problem: Coping: Goal: Psychosocial and spiritual needs will be supported Outcome: Adequate for Discharge   Problem: Respiratory: Goal: Will maintain a patent airway Outcome: Adequate for Discharge Goal:  Complications related to the disease process, condition or treatment will be avoided or minimized Outcome: Adequate for Discharge   Problem: Education: Goal: Knowledge of General Education information will improve Description: Including pain rating scale, medication(s)/side effects and non-pharmacologic comfort measures Outcome: Adequate for Discharge   Problem: Health Behavior/Discharge Planning: Goal: Ability to manage health-related needs will improve Outcome: Adequate for Discharge   Problem: Clinical Measurements: Goal: Ability to maintain clinical measurements within normal limits will improve Outcome: Adequate for Discharge Goal: Will remain free from infection Outcome: Adequate for Discharge Goal: Diagnostic test results will improve Outcome: Adequate for Discharge Goal: Respiratory complications will improve Outcome: Adequate for Discharge Goal: Cardiovascular complication will be avoided Outcome: Adequate for Discharge   Problem: Activity: Goal: Risk for activity intolerance will decrease Outcome: Adequate for Discharge   Problem: Nutrition: Goal: Adequate nutrition will be maintained Outcome: Adequate for Discharge   Problem: Coping: Goal: Level of anxiety will decrease Outcome: Adequate for Discharge   Problem: Elimination: Goal: Will not experience complications related to bowel motility Outcome: Adequate for Discharge Goal: Will not experience complications related to urinary retention Outcome: Adequate for Discharge   Problem: Pain Managment: Goal: General experience of comfort will improve Outcome: Adequate for Discharge   Problem: Skin Integrity: Goal: Risk for impaired skin integrity will decrease Outcome: Adequate for Discharge   Problem: Education: Goal: Knowledge of risk factors and measures for prevention of condition will improve Outcome: Adequate for Discharge   Problem: Coping: Goal: Psychosocial and spiritual needs will be  supported Outcome: Adequate for Discharge   Problem: Respiratory: Goal: Will maintain a patent airway Outcome: Adequate for Discharge Goal: Complications related to the disease process, condition or treatment will be avoided or minimized Outcome: Adequate for Discharge

## 2020-11-21 DIAGNOSIS — E876 Hypokalemia: Secondary | ICD-10-CM | POA: Diagnosis not present

## 2020-11-21 DIAGNOSIS — U071 COVID-19: Secondary | ICD-10-CM | POA: Diagnosis not present

## 2020-11-21 DIAGNOSIS — J9601 Acute respiratory failure with hypoxia: Secondary | ICD-10-CM | POA: Diagnosis not present

## 2020-11-21 DIAGNOSIS — R5381 Other malaise: Secondary | ICD-10-CM | POA: Diagnosis not present

## 2020-11-28 ENCOUNTER — Ambulatory Visit
Admission: RE | Admit: 2020-11-28 | Discharge: 2020-11-28 | Disposition: A | Discharge status: Home / Self Care | Payer: Medicare Other | Source: Ambulatory Visit | Attending: Family Medicine | Admitting: Family Medicine

## 2020-11-28 ENCOUNTER — Other Ambulatory Visit: Payer: Self-pay | Admitting: Family Medicine

## 2020-11-28 DIAGNOSIS — U071 COVID-19: Secondary | ICD-10-CM

## 2020-11-28 DIAGNOSIS — R059 Cough, unspecified: Secondary | ICD-10-CM | POA: Diagnosis not present

## 2020-11-29 ENCOUNTER — Other Ambulatory Visit: Payer: Self-pay | Admitting: Internal Medicine

## 2020-11-29 DIAGNOSIS — E7800 Pure hypercholesterolemia, unspecified: Secondary | ICD-10-CM | POA: Diagnosis not present

## 2020-11-29 DIAGNOSIS — U071 COVID-19: Secondary | ICD-10-CM | POA: Diagnosis not present

## 2020-11-29 DIAGNOSIS — J1281 Pneumonia due to SARS-associated coronavirus: Secondary | ICD-10-CM | POA: Diagnosis not present

## 2020-11-29 DIAGNOSIS — Z7984 Long term (current) use of oral hypoglycemic drugs: Secondary | ICD-10-CM | POA: Diagnosis not present

## 2020-11-29 DIAGNOSIS — I471 Supraventricular tachycardia: Secondary | ICD-10-CM | POA: Diagnosis not present

## 2020-11-29 DIAGNOSIS — E1165 Type 2 diabetes mellitus with hyperglycemia: Secondary | ICD-10-CM | POA: Diagnosis not present

## 2020-11-29 DIAGNOSIS — E0590 Thyrotoxicosis, unspecified without thyrotoxic crisis or storm: Secondary | ICD-10-CM | POA: Diagnosis not present

## 2021-02-07 DIAGNOSIS — I10 Essential (primary) hypertension: Secondary | ICD-10-CM | POA: Diagnosis not present

## 2021-02-07 DIAGNOSIS — J1281 Pneumonia due to SARS-associated coronavirus: Secondary | ICD-10-CM | POA: Diagnosis not present

## 2021-02-07 DIAGNOSIS — Z7984 Long term (current) use of oral hypoglycemic drugs: Secondary | ICD-10-CM | POA: Diagnosis not present

## 2021-02-07 DIAGNOSIS — E559 Vitamin D deficiency, unspecified: Secondary | ICD-10-CM | POA: Diagnosis not present

## 2021-02-07 DIAGNOSIS — E1165 Type 2 diabetes mellitus with hyperglycemia: Secondary | ICD-10-CM | POA: Diagnosis not present

## 2021-02-07 DIAGNOSIS — E0590 Thyrotoxicosis, unspecified without thyrotoxic crisis or storm: Secondary | ICD-10-CM | POA: Diagnosis not present

## 2021-03-14 ENCOUNTER — Other Ambulatory Visit: Payer: Self-pay | Admitting: Internal Medicine

## 2021-06-02 ENCOUNTER — Ambulatory Visit: Payer: Self-pay | Attending: Internal Medicine

## 2021-06-02 DIAGNOSIS — Z23 Encounter for immunization: Secondary | ICD-10-CM

## 2021-06-02 NOTE — Progress Notes (Signed)
   Covid-19 Vaccination Clinic  Name:  Wanda Johnston    MRN: [id number] DOB: [birth year]  06/02/2021  Ms. [facility] was observed post Covid-19 immunization for 15 minutes without incident. She was provided with Vaccine Information Sheet and instruction to access the V-Safe system.   Ms. Wanda Johnston was instructed to call [phone] with any severe reactions post vaccine: Difficulty breathing  Swelling of face and throat  A fast heartbeat  A bad rash all over body  Dizziness and weakness   Immunizations Administered     Name Date Dose VIS Date Route   PFIZER Comrnaty(Gray TOP) Covid-19 Vaccine 06/02/2021  8:53 AM 0.3 mL 10/06/2020 Intramuscular   Manufacturer: [employer], [employer]   Lot: [id number]   NDC: [phone]

## 2021-06-05 ENCOUNTER — Other Ambulatory Visit (HOSPITAL_BASED_OUTPATIENT_CLINIC_OR_DEPARTMENT_OTHER): Payer: Self-pay

## 2021-06-05 MED ORDER — COVID-19 MRNA VAC-TRIS(PFIZER) 30 MCG/0.3ML IM SUSP
INTRAMUSCULAR | 0 refills | Status: AC
  Filled 2021-06-05: qty 0.3, 1d supply, fill #0

## 2021-07-05 DIAGNOSIS — Z20822 Contact with and (suspected) exposure to covid-19: Secondary | ICD-10-CM | POA: Diagnosis not present

## 2021-10-05 IMAGING — CR DG CHEST 2V
2 series · 2 of 2 positions shown · non-contrast
Comparison: 11/06/2020

CLINICAL DATA: COVID, cough, congestion

EXAM:
CHEST - 2 VIEW

[w chest pa]
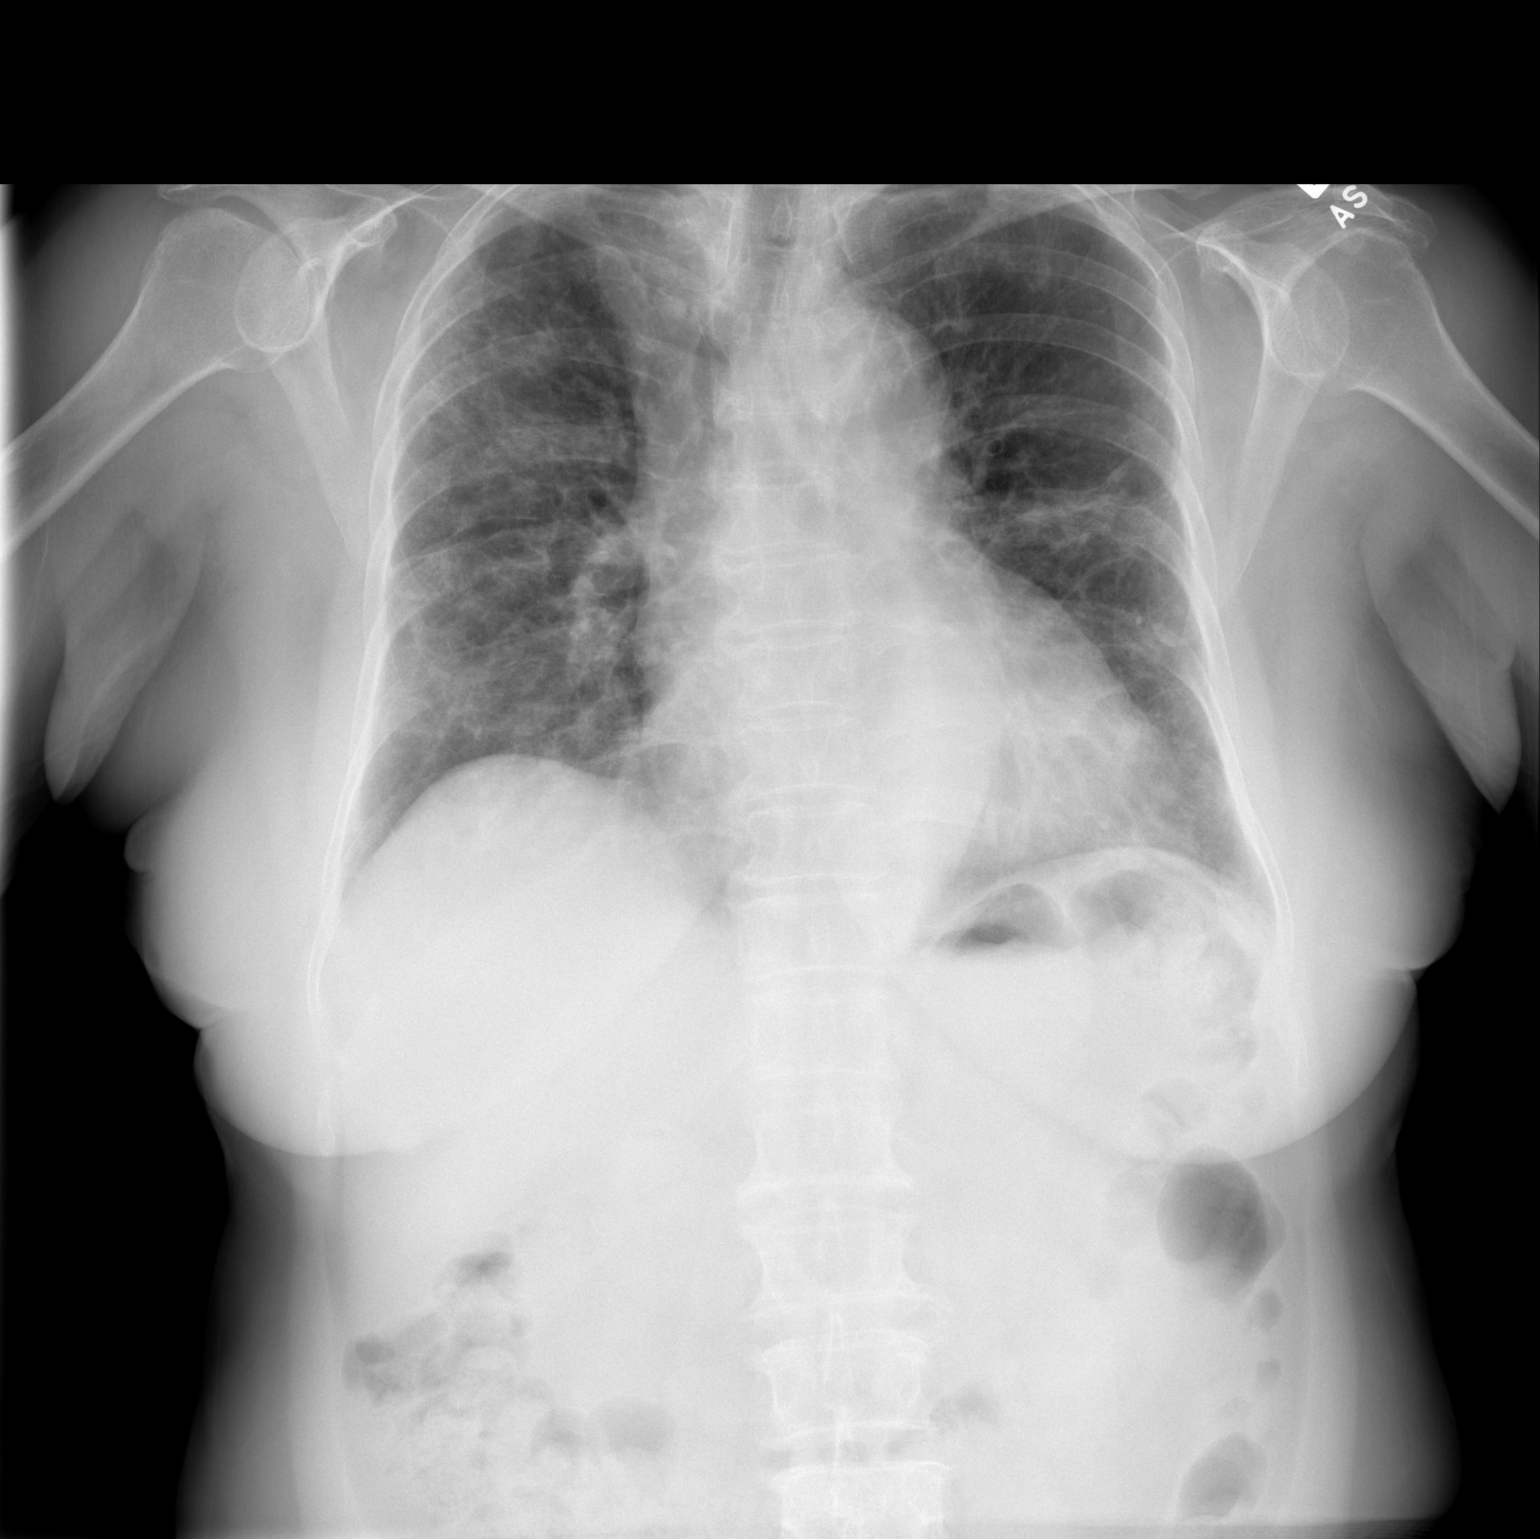

[w chest lat]
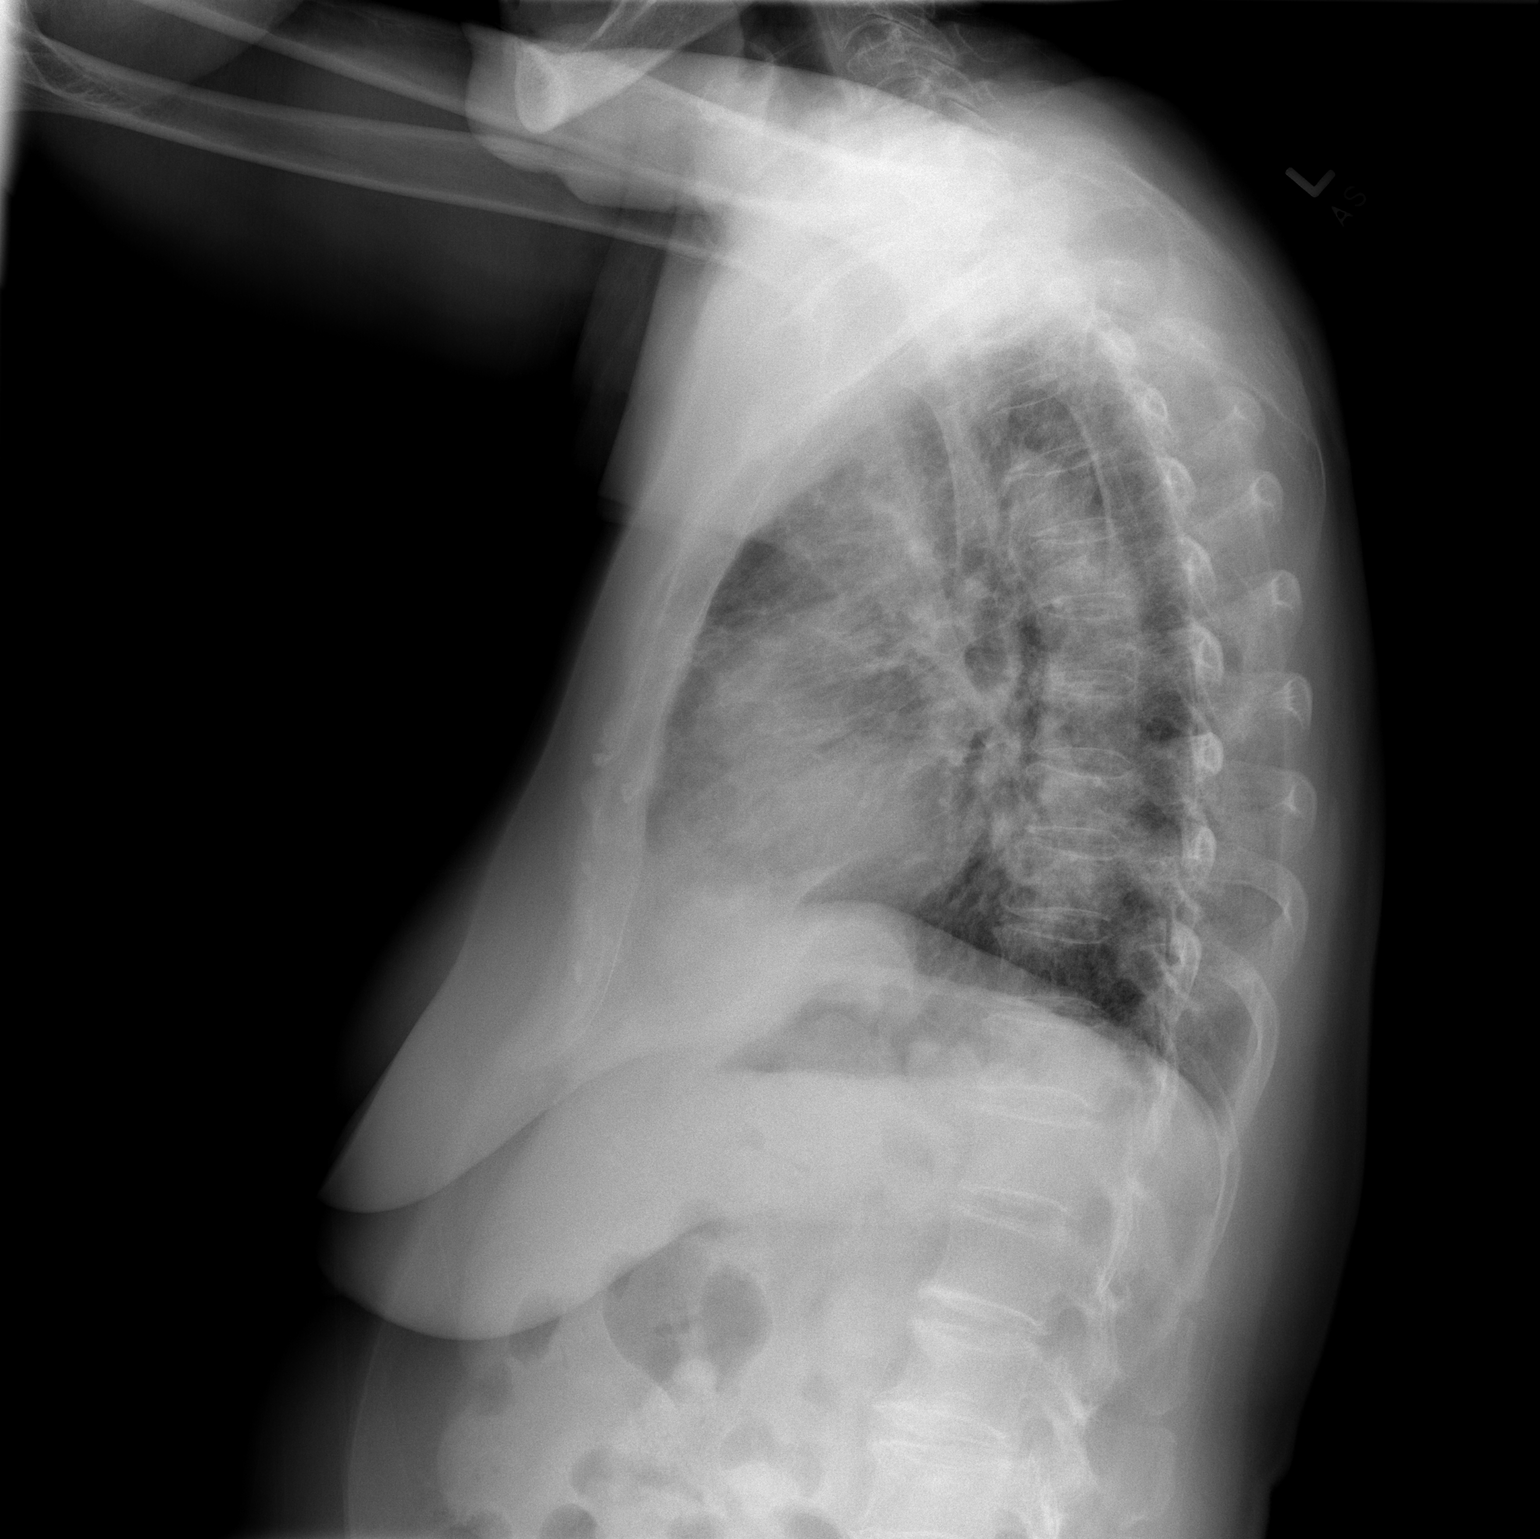

[2 of 2 positions shown; findings below may reference images not displayed]

FINDINGS: Patchy bilateral infiltrates are again noted, similar to prior
study. Heart is upper limits normal in size. No effusions or
pneumothorax. No acute bony abnormality.
IMPRESSION: Patchy bilateral pulmonary infiltrates, similar to prior study.

## 2022-02-15 DIAGNOSIS — Z20822 Contact with and (suspected) exposure to covid-19: Secondary | ICD-10-CM | POA: Diagnosis not present

## 2022-02-28 DIAGNOSIS — Z20822 Contact with and (suspected) exposure to covid-19: Secondary | ICD-10-CM | POA: Diagnosis not present

## 2022-03-04 DIAGNOSIS — Z20822 Contact with and (suspected) exposure to covid-19: Secondary | ICD-10-CM | POA: Diagnosis not present

## 2022-03-07 DIAGNOSIS — Z20822 Contact with and (suspected) exposure to covid-19: Secondary | ICD-10-CM | POA: Diagnosis not present

## 2022-06-28 DIAGNOSIS — E782 Mixed hyperlipidemia: Secondary | ICD-10-CM | POA: Diagnosis not present

## 2022-06-28 DIAGNOSIS — E559 Vitamin D deficiency, unspecified: Secondary | ICD-10-CM | POA: Diagnosis not present

## 2022-06-28 DIAGNOSIS — I10 Essential (primary) hypertension: Secondary | ICD-10-CM | POA: Diagnosis not present

## 2022-06-28 DIAGNOSIS — E11620 Type 2 diabetes mellitus with diabetic dermatitis: Secondary | ICD-10-CM | POA: Diagnosis not present

## 2022-06-28 DIAGNOSIS — Z0000 Encounter for general adult medical examination without abnormal findings: Secondary | ICD-10-CM | POA: Diagnosis not present

## 2022-06-28 DIAGNOSIS — H905 Unspecified sensorineural hearing loss: Secondary | ICD-10-CM | POA: Diagnosis not present

## 2022-06-28 DIAGNOSIS — F0390 Unspecified dementia without behavioral disturbance: Secondary | ICD-10-CM | POA: Diagnosis not present

## 2022-06-28 DIAGNOSIS — H7290 Unspecified perforation of tympanic membrane, unspecified ear: Secondary | ICD-10-CM | POA: Diagnosis not present

## 2022-06-28 DIAGNOSIS — E0590 Thyrotoxicosis, unspecified without thyrotoxic crisis or storm: Secondary | ICD-10-CM | POA: Diagnosis not present

## 2022-06-28 DIAGNOSIS — L2084 Intrinsic (allergic) eczema: Secondary | ICD-10-CM | POA: Diagnosis not present

## 2022-06-28 DIAGNOSIS — E1169 Type 2 diabetes mellitus with other specified complication: Secondary | ICD-10-CM | POA: Diagnosis not present

## 2022-06-28 DIAGNOSIS — Z79899 Other long term (current) drug therapy: Secondary | ICD-10-CM | POA: Diagnosis not present

## 2022-06-28 DIAGNOSIS — E7800 Pure hypercholesterolemia, unspecified: Secondary | ICD-10-CM | POA: Diagnosis not present

## 2022-11-11 DIAGNOSIS — R051 Acute cough: Secondary | ICD-10-CM | POA: Diagnosis not present

## 2022-12-18 DIAGNOSIS — E0590 Thyrotoxicosis, unspecified without thyrotoxic crisis or storm: Secondary | ICD-10-CM | POA: Diagnosis not present

## 2022-12-25 DIAGNOSIS — E1169 Type 2 diabetes mellitus with other specified complication: Secondary | ICD-10-CM | POA: Diagnosis not present

## 2022-12-25 DIAGNOSIS — M12812 Other specific arthropathies, not elsewhere classified, left shoulder: Secondary | ICD-10-CM | POA: Diagnosis not present

## 2022-12-25 DIAGNOSIS — L509 Urticaria, unspecified: Secondary | ICD-10-CM | POA: Diagnosis not present

## 2022-12-25 DIAGNOSIS — E0590 Thyrotoxicosis, unspecified without thyrotoxic crisis or storm: Secondary | ICD-10-CM | POA: Diagnosis not present

## 2023-02-13 DIAGNOSIS — E1169 Type 2 diabetes mellitus with other specified complication: Secondary | ICD-10-CM | POA: Diagnosis not present

## 2023-02-13 DIAGNOSIS — R42 Dizziness and giddiness: Secondary | ICD-10-CM | POA: Diagnosis not present

## 2023-02-13 DIAGNOSIS — M25512 Pain in left shoulder: Secondary | ICD-10-CM | POA: Diagnosis not present

## 2023-07-05 DIAGNOSIS — R42 Dizziness and giddiness: Secondary | ICD-10-CM | POA: Diagnosis not present

## 2023-07-05 DIAGNOSIS — Z03818 Encounter for observation for suspected exposure to other biological agents ruled out: Secondary | ICD-10-CM | POA: Diagnosis not present

## 2023-07-05 DIAGNOSIS — E1169 Type 2 diabetes mellitus with other specified complication: Secondary | ICD-10-CM | POA: Diagnosis not present

## 2023-07-05 DIAGNOSIS — H7290 Unspecified perforation of tympanic membrane, unspecified ear: Secondary | ICD-10-CM | POA: Diagnosis not present

## 2023-07-05 DIAGNOSIS — Z79899 Other long term (current) drug therapy: Secondary | ICD-10-CM | POA: Diagnosis not present

## 2023-07-05 DIAGNOSIS — I10 Essential (primary) hypertension: Secondary | ICD-10-CM | POA: Diagnosis not present

## 2023-07-05 DIAGNOSIS — E559 Vitamin D deficiency, unspecified: Secondary | ICD-10-CM | POA: Diagnosis not present

## 2023-07-05 DIAGNOSIS — E0590 Thyrotoxicosis, unspecified without thyrotoxic crisis or storm: Secondary | ICD-10-CM | POA: Diagnosis not present

## 2023-07-05 DIAGNOSIS — E1140 Type 2 diabetes mellitus with diabetic neuropathy, unspecified: Secondary | ICD-10-CM | POA: Diagnosis not present

## 2023-07-05 DIAGNOSIS — J069 Acute upper respiratory infection, unspecified: Secondary | ICD-10-CM | POA: Diagnosis not present

## 2023-07-05 DIAGNOSIS — R509 Fever, unspecified: Secondary | ICD-10-CM | POA: Diagnosis not present

## 2023-07-05 DIAGNOSIS — Z0000 Encounter for general adult medical examination without abnormal findings: Secondary | ICD-10-CM | POA: Diagnosis not present

## 2023-10-11 DIAGNOSIS — J189 Pneumonia, unspecified organism: Secondary | ICD-10-CM | POA: Diagnosis not present

## 2023-10-11 DIAGNOSIS — E0590 Thyrotoxicosis, unspecified without thyrotoxic crisis or storm: Secondary | ICD-10-CM | POA: Diagnosis not present

## 2023-10-11 DIAGNOSIS — E1169 Type 2 diabetes mellitus with other specified complication: Secondary | ICD-10-CM | POA: Diagnosis not present

## 2024-07-17 DIAGNOSIS — E7800 Pure hypercholesterolemia, unspecified: Secondary | ICD-10-CM | POA: Diagnosis not present

## 2024-07-17 DIAGNOSIS — E1142 Type 2 diabetes mellitus with diabetic polyneuropathy: Secondary | ICD-10-CM | POA: Diagnosis not present

## 2024-07-17 DIAGNOSIS — E0590 Thyrotoxicosis, unspecified without thyrotoxic crisis or storm: Secondary | ICD-10-CM | POA: Diagnosis not present

## 2024-07-17 DIAGNOSIS — E1165 Type 2 diabetes mellitus with hyperglycemia: Secondary | ICD-10-CM | POA: Diagnosis not present

## 2024-07-17 DIAGNOSIS — Z0000 Encounter for general adult medical examination without abnormal findings: Secondary | ICD-10-CM | POA: Diagnosis not present

## 2024-07-17 DIAGNOSIS — Z13820 Encounter for screening for osteoporosis: Secondary | ICD-10-CM | POA: Diagnosis not present

## 2024-07-17 DIAGNOSIS — I10 Essential (primary) hypertension: Secondary | ICD-10-CM | POA: Diagnosis not present

## 2024-08-10 DIAGNOSIS — E782 Mixed hyperlipidemia: Secondary | ICD-10-CM | POA: Diagnosis not present

## 2024-08-10 DIAGNOSIS — E1159 Type 2 diabetes mellitus with other circulatory complications: Secondary | ICD-10-CM | POA: Diagnosis not present

## 2024-08-10 DIAGNOSIS — I152 Hypertension secondary to endocrine disorders: Secondary | ICD-10-CM | POA: Diagnosis not present

## 2024-08-10 DIAGNOSIS — E1165 Type 2 diabetes mellitus with hyperglycemia: Secondary | ICD-10-CM | POA: Diagnosis not present

## 2024-08-10 DIAGNOSIS — E1142 Type 2 diabetes mellitus with diabetic polyneuropathy: Secondary | ICD-10-CM | POA: Diagnosis not present
